# Patient Record
Sex: Female | Born: 1980
Health system: Southern US, Community
[De-identification: ages and names within clinical notes are randomized; demographics above are authoritative.]

## PROBLEM LIST (undated history)

## (undated) DIAGNOSIS — F32A Depression, unspecified: Secondary | ICD-10-CM

## (undated) DIAGNOSIS — R51 Headache: Secondary | ICD-10-CM

## (undated) DIAGNOSIS — G43109 Migraine with aura, not intractable, without status migrainosus: Secondary | ICD-10-CM

## (undated) DIAGNOSIS — R519 Headache, unspecified: Secondary | ICD-10-CM

## (undated) DIAGNOSIS — J45909 Unspecified asthma, uncomplicated: Secondary | ICD-10-CM

## (undated) DIAGNOSIS — F329 Major depressive disorder, single episode, unspecified: Secondary | ICD-10-CM

## (undated) HISTORY — PX: SPINAL FUSION: SHX223

## (undated) HISTORY — DX: Depression, unspecified: F32.A

## (undated) HISTORY — DX: Major depressive disorder, single episode, unspecified: F32.9

## (undated) HISTORY — PX: TUBAL LIGATION: SHX77

## (undated) HISTORY — PX: SHOULDER ARTHROCENTESIS: SUR47

## (undated) HISTORY — DX: Migraine with aura, not intractable, without status migrainosus: G43.109

---

## 2002-12-27 HISTORY — PX: FRACTURE SURGERY: SHX138

## 2004-12-27 HISTORY — PX: LEEP: SHX91

## 2005-12-27 HISTORY — PX: DILATION AND CURETTAGE OF UTERUS: SHX78

## 2006-08-31 ENCOUNTER — Ambulatory Visit: Payer: Self-pay | Admitting: Obstetrics and Gynecology

## 2007-05-11 ENCOUNTER — Encounter: Payer: Self-pay | Admitting: Maternal & Fetal Medicine

## 2007-06-08 ENCOUNTER — Encounter: Payer: Self-pay | Admitting: Maternal & Fetal Medicine

## 2007-08-21 ENCOUNTER — Encounter: Payer: Self-pay | Admitting: Maternal and Fetal Medicine

## 2007-10-13 ENCOUNTER — Observation Stay: Payer: Self-pay | Admitting: Obstetrics and Gynecology

## 2007-10-16 ENCOUNTER — Encounter: Payer: Self-pay | Admitting: Maternal & Fetal Medicine

## 2007-10-20 ENCOUNTER — Observation Stay: Payer: Self-pay | Admitting: Obstetrics and Gynecology

## 2007-10-23 ENCOUNTER — Encounter: Payer: Self-pay | Admitting: Maternal & Fetal Medicine

## 2007-10-26 ENCOUNTER — Ambulatory Visit: Payer: Self-pay | Admitting: Obstetrics and Gynecology

## 2007-10-27 ENCOUNTER — Inpatient Hospital Stay: Payer: Self-pay | Admitting: Obstetrics and Gynecology

## 2014-01-10 LAB — HM PAP SMEAR: HM PAP: NEGATIVE

## 2014-05-13 ENCOUNTER — Ambulatory Visit: Payer: Self-pay

## 2015-04-23 ENCOUNTER — Emergency Department
Admit: 2015-04-23 | Disposition: A | Discharge status: Home / Self Care | Payer: Self-pay | Admitting: Emergency Medicine

## 2015-04-23 LAB — URINALYSIS, COMPLETE
BILIRUBIN, UR: NEGATIVE
Blood: NEGATIVE
Glucose,UR: NEGATIVE mg/dL (ref 0–75)
KETONE: NEGATIVE
Leukocyte Esterase: NEGATIVE
Nitrite: NEGATIVE
PH: 5 (ref 4.5–8.0)
Protein: NEGATIVE
Specific Gravity: 1.023 (ref 1.003–1.030)

## 2015-04-23 LAB — CBC WITH DIFFERENTIAL/PLATELET
BASOS ABS: 0 10*3/uL (ref 0.0–0.1)
BASOS PCT: 0.2 %
Eosinophil #: 0 10*3/uL (ref 0.0–0.7)
Eosinophil %: 0.6 %
HCT: 42.1 % (ref 35.0–47.0)
HGB: 14.1 g/dL (ref 12.0–16.0)
LYMPHS PCT: 18.6 %
Lymphocyte #: 1.3 10*3/uL (ref 1.0–3.6)
MCH: 30.1 pg (ref 26.0–34.0)
MCHC: 33.4 g/dL (ref 32.0–36.0)
MCV: 90 fL (ref 80–100)
Monocyte #: 0.6 x10 3/mm (ref 0.2–0.9)
Monocyte %: 8.1 %
NEUTROS PCT: 72.5 %
Neutrophil #: 4.9 10*3/uL (ref 1.4–6.5)
Platelet: 158 10*3/uL (ref 150–440)
RBC: 4.67 10*6/uL (ref 3.80–5.20)
RDW: 13.4 % (ref 11.5–14.5)
WBC: 6.8 10*3/uL (ref 3.6–11.0)

## 2015-04-23 LAB — COMPREHENSIVE METABOLIC PANEL
ANION GAP: 8 (ref 7–16)
Albumin: 4.7 g/dL
Alkaline Phosphatase: 101 U/L
BILIRUBIN TOTAL: 1.5 mg/dL — AB
BUN: 13 mg/dL
Calcium, Total: 9 mg/dL
Chloride: 104 mmol/L
Co2: 27 mmol/L
Creatinine: 0.86 mg/dL
Glucose: 102 mg/dL — ABNORMAL HIGH
Potassium: 3.6 mmol/L
SGOT(AST): 384 U/L — ABNORMAL HIGH
SGPT (ALT): 301 U/L — ABNORMAL HIGH
Sodium: 139 mmol/L
Total Protein: 8.1 g/dL

## 2015-05-09 ENCOUNTER — Other Ambulatory Visit: Payer: Self-pay

## 2015-05-09 DIAGNOSIS — Z800 Family history of malignant neoplasm of digestive organs: Secondary | ICD-10-CM | POA: Diagnosis not present

## 2015-05-09 DIAGNOSIS — Z8249 Family history of ischemic heart disease and other diseases of the circulatory system: Secondary | ICD-10-CM | POA: Diagnosis not present

## 2015-05-09 DIAGNOSIS — Z806 Family history of leukemia: Secondary | ICD-10-CM | POA: Diagnosis not present

## 2015-05-09 DIAGNOSIS — R51 Headache: Secondary | ICD-10-CM | POA: Diagnosis not present

## 2015-05-09 DIAGNOSIS — J45990 Exercise induced bronchospasm: Secondary | ICD-10-CM | POA: Diagnosis not present

## 2015-05-09 DIAGNOSIS — Z803 Family history of malignant neoplasm of breast: Secondary | ICD-10-CM | POA: Diagnosis not present

## 2015-05-09 DIAGNOSIS — Z79899 Other long term (current) drug therapy: Secondary | ICD-10-CM | POA: Diagnosis not present

## 2015-05-09 DIAGNOSIS — K8010 Calculus of gallbladder with chronic cholecystitis without obstruction: Secondary | ICD-10-CM | POA: Diagnosis present

## 2015-05-09 NOTE — Patient Instructions (Signed)
  Your procedure is scheduled on: May 16, 2015. Report to Same Day Surgery. To find out your arrival time please call [phone] between 1PM - 3PM on Thursday May 15, 2015.  Remember: Instructions that are not followed completely may result in serious medical risk, up to and including death, or upon the discretion of your surgeon and anesthesiologist your surgery may need to be rescheduled.    __X__ 1. Do not eat food or drink liquids after midnight. No gum chewing or hard candies.     __X__ 2. No Alcohol for 24 hours before or after surgery.   ____ 3. Bring all medications with you on the day of surgery if instructed.    __X__ 4. Notify your doctor if there is any change in your medical condition     (cold, fever, infections).     Do not wear jewelry, make-up, hairpins, clips or nail polish.  Do not wear lotions, powders, or perfumes. You may wear deodorant.  Do not shave 48 hours prior to surgery. Men may shave face and neck.  Do not bring valuables to the hospital.    [facility] is not responsible for any belongings or valuables.               Contacts, dentures or bridgework may not be worn into surgery.  Leave your suitcase in the car. After surgery it may be brought to your room.  For patients admitted to the hospital, discharge time is determined by your treatment team.   Patients discharged the day of surgery will not be allowed to drive home.   Please read over the following fact sheets that you were given:      __X__ Take these medicines the morning of surgery with A SIP OF WATER:    1. [place]   ____ Fleet Enema (as directed)   ____ Use CHG Soap as directed  __X Use inhalers on the day of surgery  ____ Stop metformin 2 days prior to surgery    ____ Take 1/2 of usual insulin dose the night before surgery and none on the morning of surgery.   ____ Stop Coumadin/Plavix/aspirin on DOES NOT APPLY  ____ Stop Anti-inflammatories on TODAY, MAY  TAKE TYLENOL IF NEEDED. ____ Stop supplements until after surgery.    ____ Bring C-Pap to the hospital.

## 2015-05-15 ENCOUNTER — Encounter
Admission: RE | Admit: 2015-05-15 | Discharge: 2015-05-15 | Disposition: A | Discharge status: Home / Self Care | Payer: BC Managed Care – PPO | Source: Ambulatory Visit | Attending: Surgery | Admitting: Surgery

## 2015-05-15 DIAGNOSIS — K8010 Calculus of gallbladder with chronic cholecystitis without obstruction: Secondary | ICD-10-CM | POA: Diagnosis not present

## 2015-05-15 LAB — URINALYSIS COMPLETE WITH MICROSCOPIC (ARMC ONLY)
BILIRUBIN URINE: NEGATIVE
GLUCOSE, UA: NEGATIVE mg/dL
Ketones, ur: NEGATIVE mg/dL
NITRITE: NEGATIVE
Protein, ur: NEGATIVE mg/dL
Specific Gravity, Urine: 1.008 (ref 1.005–1.030)
pH: 6 (ref 5.0–8.0)

## 2015-05-15 LAB — BILIRUBIN, TOTAL: Total Bilirubin: 0.2 mg/dL — ABNORMAL LOW (ref 0.3–1.2)

## 2015-05-16 ENCOUNTER — Encounter: Payer: Self-pay | Admitting: *Deleted

## 2015-05-16 ENCOUNTER — Ambulatory Visit: Payer: BC Managed Care – PPO | Admitting: Anesthesiology

## 2015-05-16 ENCOUNTER — Ambulatory Visit: Payer: BC Managed Care – PPO

## 2015-05-16 ENCOUNTER — Encounter
Admission: RE | Disposition: A | Discharge status: Home / Self Care | Payer: Self-pay | Source: Ambulatory Visit | Attending: Surgery

## 2015-05-16 ENCOUNTER — Ambulatory Visit
Admission: RE | Admit: 2015-05-16 | Discharge: 2015-05-16 | Disposition: A | Discharge status: Home / Self Care | Payer: BC Managed Care – PPO | Source: Ambulatory Visit | Attending: Surgery | Admitting: Surgery

## 2015-05-16 DIAGNOSIS — K8010 Calculus of gallbladder with chronic cholecystitis without obstruction: Secondary | ICD-10-CM | POA: Insufficient documentation

## 2015-05-16 DIAGNOSIS — Z8249 Family history of ischemic heart disease and other diseases of the circulatory system: Secondary | ICD-10-CM | POA: Insufficient documentation

## 2015-05-16 DIAGNOSIS — Z800 Family history of malignant neoplasm of digestive organs: Secondary | ICD-10-CM | POA: Insufficient documentation

## 2015-05-16 DIAGNOSIS — Z79899 Other long term (current) drug therapy: Secondary | ICD-10-CM | POA: Insufficient documentation

## 2015-05-16 DIAGNOSIS — R51 Headache: Secondary | ICD-10-CM | POA: Insufficient documentation

## 2015-05-16 DIAGNOSIS — Z803 Family history of malignant neoplasm of breast: Secondary | ICD-10-CM | POA: Insufficient documentation

## 2015-05-16 DIAGNOSIS — Z806 Family history of leukemia: Secondary | ICD-10-CM | POA: Insufficient documentation

## 2015-05-16 DIAGNOSIS — J45990 Exercise induced bronchospasm: Secondary | ICD-10-CM | POA: Insufficient documentation

## 2015-05-16 DIAGNOSIS — K819 Cholecystitis, unspecified: Secondary | ICD-10-CM

## 2015-05-16 HISTORY — DX: Unspecified asthma, uncomplicated: J45.909

## 2015-05-16 HISTORY — PX: CHOLECYSTECTOMY: SHX55

## 2015-05-16 HISTORY — DX: Headache, unspecified: R51.9

## 2015-05-16 HISTORY — DX: Headache: R51

## 2015-05-16 LAB — POCT PREGNANCY, URINE: Preg Test, Ur: NEGATIVE

## 2015-05-16 SURGERY — LAPAROSCOPIC CHOLECYSTECTOMY WITH INTRAOPERATIVE CHOLANGIOGRAM
Anesthesia: General | Site: Abdomen | Wound class: Clean Contaminated

## 2015-05-16 MED ORDER — NEOSTIGMINE METHYLSULFATE 10 MG/10ML IV SOLN
INTRAVENOUS | Status: DC | PRN
  Administered 2015-05-16: 4 mg via INTRAVENOUS

## 2015-05-16 MED ORDER — ROCURONIUM BROMIDE 100 MG/10ML IV SOLN
INTRAVENOUS | Status: DC | PRN
  Administered 2015-05-16: 40 mg via INTRAVENOUS
  Administered 2015-05-16: 5 mg via INTRAVENOUS

## 2015-05-16 MED ORDER — HYDROMORPHONE HCL 1 MG/ML IJ SOLN
INTRAMUSCULAR | Status: DC
Start: 2015-05-16 — End: 2015-05-16
  Filled 2015-05-16: qty 1

## 2015-05-16 MED ORDER — SODIUM CHLORIDE 0.9 % IJ SOLN
INTRAMUSCULAR | Status: AC
  Filled 2015-05-16: qty 50

## 2015-05-16 MED ORDER — HYDROMORPHONE HCL 1 MG/ML IJ SOLN
0.2500 mg | INTRAMUSCULAR | Status: DC | PRN
  Administered 2015-05-16 (×4): 0.5 mg via INTRAVENOUS

## 2015-05-16 MED ORDER — HYDROMORPHONE HCL 1 MG/ML IJ SOLN
INTRAMUSCULAR | Status: AC
  Filled 2015-05-16: qty 1

## 2015-05-16 MED ORDER — FAMOTIDINE 20 MG PO TABS
20.0000 mg | ORAL_TABLET | Freq: Once | ORAL | Status: AC
  Administered 2015-05-16: 20 mg via ORAL

## 2015-05-16 MED ORDER — OXYCODONE-ACETAMINOPHEN 5-325 MG PO TABS: 1.0000 | ORAL_TABLET | ORAL | Status: DC | PRN

## 2015-05-16 MED ORDER — KETOROLAC TROMETHAMINE 30 MG/ML IJ SOLN
INTRAMUSCULAR | Status: DC | PRN
  Administered 2015-05-16: 30 mg via INTRAVENOUS

## 2015-05-16 MED ORDER — ONDANSETRON HCL 4 MG/2ML IJ SOLN
INTRAMUSCULAR | Status: DC | PRN
  Administered 2015-05-16: 4 mg via INTRAVENOUS

## 2015-05-16 MED ORDER — FAMOTIDINE 20 MG PO TABS
ORAL_TABLET | ORAL | Status: AC
Start: 2015-05-16 — End: 2015-05-16
  Administered 2015-05-16: 20 mg
  Filled 2015-05-16: qty 1

## 2015-05-16 MED ORDER — GLYCOPYRROLATE 0.2 MG/ML IJ SOLN
INTRAMUSCULAR | Status: DC | PRN
  Administered 2015-05-16: 0.6 mg via INTRAVENOUS

## 2015-05-16 MED ORDER — MIDAZOLAM HCL 2 MG/2ML IJ SOLN
INTRAMUSCULAR | Status: DC | PRN
  Administered 2015-05-16: 2 mg via INTRAVENOUS

## 2015-05-16 MED ORDER — DEXAMETHASONE SODIUM PHOSPHATE 10 MG/ML IJ SOLN
INTRAMUSCULAR | Status: AC
  Administered 2015-05-16: 8 mg
  Filled 2015-05-16: qty 1

## 2015-05-16 MED ORDER — LACTATED RINGERS IV SOLN
INTRAVENOUS | Status: DC
  Administered 2015-05-16: 11:00:00 via INTRAVENOUS

## 2015-05-16 MED ORDER — HEPARIN SODIUM (PORCINE) 5000 UNIT/ML IJ SOLN
INTRAMUSCULAR | Status: AC
  Filled 2015-05-16: qty 1

## 2015-05-16 MED ORDER — FENTANYL CITRATE (PF) 100 MCG/2ML IJ SOLN
INTRAMUSCULAR | Status: DC | PRN
  Administered 2015-05-16: 50 ug via INTRAVENOUS
  Administered 2015-05-16: 100 ug via INTRAVENOUS
  Administered 2015-05-16 (×3): 50 ug via INTRAVENOUS

## 2015-05-16 MED ORDER — DEXAMETHASONE SODIUM PHOSPHATE 4 MG/ML IJ SOLN
8.0000 mg | Freq: Once | INTRAMUSCULAR | Status: AC | PRN
  Administered 2015-05-16: 8 mg via INTRAVENOUS
  Filled 2015-05-16: qty 2

## 2015-05-16 MED ORDER — PROPOFOL 10 MG/ML IV BOLUS
INTRAVENOUS | Status: DC | PRN
  Administered 2015-05-16: 200 mg via INTRAVENOUS

## 2015-05-16 SURGICAL SUPPLY — 37 items
APPLIER CLIP ROT 10 11.4 M/L (STAPLE) ×3
BENZOIN TINCTURE PRP APPL 2/3 (GAUZE/BANDAGES/DRESSINGS) ×3 IMPLANT
CANISTER SUCT 1200ML W/VALVE (MISCELLANEOUS) ×3 IMPLANT
CANNULA DILATOR 10 W/SLV (CANNULA) ×2 IMPLANT
CANNULA DILATOR 10MM W/SLV (CANNULA) ×1
CATH REDDICK CHOLANGI 4FR 50CM (CATHETERS) ×3 IMPLANT
CHLORAPREP W/TINT 26ML (MISCELLANEOUS) ×3 IMPLANT
CLIP APPLIE ROT 10 11.4 M/L (STAPLE) ×1 IMPLANT
CLOSURE WOUND 1/2 X4 (GAUZE/BANDAGES/DRESSINGS) ×1
DRAPE SHEET LG 3/4 BI-LAMINATE (DRAPES) ×3 IMPLANT
GAUZE SPONGE 4X4 12PLY STRL (GAUZE/BANDAGES/DRESSINGS) ×3 IMPLANT
GLOVE BIO SURGEON STRL SZ7.5 (GLOVE) ×3 IMPLANT
GOWN STRL REUS W/ TWL LRG LVL3 (GOWN DISPOSABLE) ×4 IMPLANT
GOWN STRL REUS W/TWL LRG LVL3 (GOWN DISPOSABLE) ×8
IRRIGATION STRYKERFLOW (MISCELLANEOUS) ×1 IMPLANT
IRRIGATOR STRYKERFLOW (MISCELLANEOUS) ×3
IV NS 1000ML (IV SOLUTION) ×2
IV NS 1000ML BAXH (IV SOLUTION) ×1 IMPLANT
KIT RM TURNOVER STRD PROC AR (KITS) ×3 IMPLANT
LABEL OR SOLS (LABEL) ×3 IMPLANT
NDL INSUFF ACCESS 14 VERSASTEP (NEEDLE) ×3 IMPLANT
NEEDLE FILTER BLUNT 18X 1/2SAF (NEEDLE) ×2
NEEDLE FILTER BLUNT 18X1 1/2 (NEEDLE) ×1 IMPLANT
NS IRRIG 500ML POUR BTL (IV SOLUTION) ×3 IMPLANT
PACK LAP CHOLECYSTECTOMY (MISCELLANEOUS) ×3 IMPLANT
PAD GROUND ADULT SPLIT (MISCELLANEOUS) ×3 IMPLANT
SCISSORS METZENBAUM CVD 33 (INSTRUMENTS) ×3 IMPLANT
SEAL FOR SCOPE WARMER C3101 (MISCELLANEOUS) ×3 IMPLANT
SLEEVE ENDOPATH XCEL 5M (ENDOMECHANICALS) ×3 IMPLANT
STRIP CLOSURE SKIN 1/2X4 (GAUZE/BANDAGES/DRESSINGS) ×2 IMPLANT
SUT CHROMIC 5 0 RB 1 27 (SUTURE) ×3 IMPLANT
SUT VIC AB 0 CT2 27 (SUTURE) IMPLANT
SYR 3ML LL SCALE MARK (SYRINGE) ×3 IMPLANT
TROCAR XCEL NON-BLD 11X100MML (ENDOMECHANICALS) ×3 IMPLANT
TROCAR XCEL NON-BLD 5MMX100MML (ENDOMECHANICALS) ×3 IMPLANT
TUBING INSUFFLATOR HI FLOW (MISCELLANEOUS) ×3 IMPLANT
WATER STERILE IRR 1000ML POUR (IV SOLUTION) ×3 IMPLANT

## 2015-05-16 NOTE — Anesthesia Preprocedure Evaluation (Signed)
Anesthesia Evaluation  Patient identified by MRN, date of birth, ID band Patient awake    Reviewed: Allergy & Precautions, NPO status , Patient's Chart, lab work & pertinent test results  Airway Mallampati: I  TM Distance: >3 FB Neck ROM: Full    Dental  (+) Teeth Intact   Pulmonary asthma ,  Mild, exercise induced, puffers prn. breath sounds clear to auscultation  Pulmonary exam normal       Cardiovascular Exercise Tolerance: Good negative cardio ROS Normal cardiovascular examRhythm:Regular Rate:Normal  A healthy runner training for a half marathon.   Neuro/Psych  Headaches,    GI/Hepatic Cholelithiasis.   Endo/Other  negative endocrine ROS  Renal/GU negative Renal ROS     Musculoskeletal negative musculoskeletal ROS (+)   Abdominal (+)  Abdomen: soft.    Peds  Hematology negative hematology ROS (+)   Anesthesia Other Findings   Reproductive/Obstetrics Beta HCG negative.                             Anesthesia Physical Anesthesia Plan  ASA: II  Anesthesia Plan: General   Post-op Pain Management:    Induction: Intravenous  Airway Management Planned: Oral ETT  Additional Equipment:   Intra-op Plan:   Post-operative Plan: Extubation in OR  Informed Consent: I have reviewed the patients History and Physical, chart, labs and discussed the procedure including the risks, benefits and alternatives for the proposed anesthesia with the patient or authorized representative who has indicated his/her understanding and acceptance.     Plan Discussed with: CRNA  Anesthesia Plan Comments:         Anesthesia Quick Evaluation

## 2015-05-16 NOTE — Discharge Instructions (Signed)
Take Tylenol or Percocet as needed for pain.   Remove dressings on Saturday. May shower SundayGeneral Anesthesia, Care After Refer to this sheet in the next few weeks. These instructions provide you with information on caring for yourself after your procedure. Your health care provider may also give you more specific instructions. Your treatment has been planned according to current medical practices, but problems sometimes occur. Call your health care provider if you have any problems or questions after your procedure. WHAT TO EXPECT AFTER THE PROCEDURE After the procedure, it is typical to experience:  Sleepiness.  Nausea and vomiting. HOME CARE INSTRUCTIONS  For the first 24 hours after general anesthesia:  Have a responsible person with you.  Do not drive a car. If you are alone, do not take public transportation.  Do not drink alcohol.  Do not take medicine that has not been prescribed by your health care provider.  Do not sign important papers or make important decisions.  You may resume a normal diet and activities as directed by your health care provider.  Change bandages (dressings) as directed.  If you have questions or problems that seem related to general anesthesia, call the hospital and ask for the anesthetist or anesthesiologist on call. SEEK MEDICAL CARE IF:  You have nausea and vomiting that continue the day after anesthesia.  You develop a rash. SEEK IMMEDIATE MEDICAL CARE IF:   You have difficulty breathing.  You have chest pain.  You have any allergic problems. Document Released: 03/21/2001 Document Revised: 12/18/2013 Document Reviewed: 06/28/2013 [facility] Patient Information 2015 [place], [place]. This information is not intended to replace advice given to you by your health care provider. Make sure you discuss any questions you have with your health care provider.

## 2015-05-16 NOTE — Transfer of Care (Signed)
Immediate Anesthesia Transfer of Care Note  Patient: Regina Anderson  Procedure(s) Performed: Procedure(s): LAPAROSCOPIC CHOLECYSTECTOMY WITH INTRAOPERATIVE CHOLANGIOGRAM (N/A)  Patient Location: PACU  Anesthesia Type:General  Level of Consciousness: sedated and responds to stimulation  Airway & Oxygen Therapy: Patient Spontanous Breathing and Patient connected to face mask oxygen  Post-op Assessment: Report given to RN and Post -op Vital signs reviewed and stable  Post vital signs: Reviewed and stable  Last Vitals:  Filed Vitals:   05/16/15 1032  BP: 111/43  Pulse: 83  Temp: 36.5 C  Resp: 16    Complications: No apparent anesthesia complications

## 2015-05-16 NOTE — Op Note (Addendum)
OPERATIVE REPORT  PREOPERATIVE DIAGNOSIS:  Chronic cholecystitis cholelithiasis  POSTOPERATIVE DIAGNOSIS: Chronic cholecystitis cholelithiasis  PROCEDURE: Laparoscopic cholecystectomy   ANESTHESIA: General  SURGEON: Rochel Brome M.D.  INDICATIONS: She is a history of right upper quadrant pain associated with nausea. Ultrasound demonstrated gallstones. Surgery was recommended for definitive treatment  With the patient on the operating table in the supine position under general endotracheal anesthesia the abdomen was prepared with ChloraPrep solution and draped in a sterile manner. A short incision was made in the inferior aspect of the umbilicus and carried down to the deep fascia which was grasped with a laryngeal hook. The 10 mm 0 laparoscope was advanced into the trocar and dissected down through the abdominal wall and into the peritoneal cavity viewing this with the video monitor. Another incision was made in the epigastrium slightly to the right of the midline to introduce an 11 mm cannula. 2 incisions were made in the lateral aspect of the right upper quadrant to introduce 2   5 mm cannulas. Initial inspection revealed the liver was smooth. There were a few adhesions in the pelvis. The gallbladder was retracted towards the right shoulder.   The gallbladder neck was retracted inferiorly and laterally.  The porta hepatis was identified. The gallbladder was mobilized with incision of the visceral peritoneum. The cystic duct was dissected free from surrounding structures. The cystic artery was dissected free from surrounding structures. A critical view of safety was demonstrated  An Endo Clip was placed across the cystic duct adjacent to the gallbladder neck. An incision was made in the cystic duct to introduce a Reddick catheter. The cystic duct was found to be small in size and the Reddick catheter would not thread into the cystic duct. Therefore a cholangiogram was not done  The Reddick catheter  was removed. The cystic duct was doubly ligated with endoclips and divided. The cystic artery was controlled with double endoclips and divided. The gallbladder was dissected free from the liver with use of hook and cautery and blunt dissection. Bleeding was minimal and hemostasis surgically intact. The gallbladder was delivered up through the infraumbilical incision opened and suctioned.. There was a large stone which was crushed and removed in a piecemeal fashion with the stone forceps and stone scoop. The gallbladder was then removed and submitted in formalin with stones for routine pathology. The right upper quadrant was further inspected and determined hemostasis was intact. The cannulas were removed seen and no significant bleeding from the cannula sites. The skin incisions were closed with interrupted 5-0 chromic subcutaneous suture benzoin and Steri-Strips. Gauze dressings were applied with paper tape.  The patient appeared to be in satisfactory condition and was prepared for transfer to the recovery room  [place].D.

## 2015-05-16 NOTE — Anesthesia Postprocedure Evaluation (Signed)
  Anesthesia Post-op Note  Patient: [name]  Procedure(s) Performed: Procedure(s): LAPAROSCOPIC CHOLECYSTECTOMY WITH INTRAOPERATIVE CHOLANGIOGRAM (N/A)  Anesthesia type:General  Patient location: PACU  Post pain: Pain level controlled  Post assessment: Post-op Vital signs reviewed, Patient's Cardiovascular Status Stable, Respiratory Function Stable, Patent Airway and No signs of Nausea or vomiting  Post vital signs: Reviewed and stable  Last Vitals:  Filed Vitals:   05/16/15 1425  BP:   Pulse: 61  Temp:   Resp: 11    Level of consciousness: awake, alert  and patient cooperative  Complications: No apparent anesthesia complications

## 2015-05-16 NOTE — Anesthesia Procedure Notes (Signed)
Procedure Name: Intubation Date/Time: 05/16/2015 11:44 AM Performed by: Jonna Clark Pre-anesthesia Checklist: Patient identified, Emergency Drugs available, Suction available, Patient being monitored and Timeout performed Patient Re-evaluated:Patient Re-evaluated prior to inductionOxygen Delivery Method: Circle system utilized Preoxygenation: Pre-oxygenation with 100% oxygen Intubation Type: IV induction Ventilation: Mask ventilation without difficulty Laryngoscope Size: Miller and 2 Grade View: Grade I Tube size: 7.0 mm Number of attempts: 1 Placement Confirmation: ETT inserted through vocal cords under direct vision,  positive ETCO2 and breath sounds checked- equal and bilateral Secured at: 22 cm Tube secured with: Tape Dental Injury: Teeth and Oropharynx as per pre-operative assessment

## 2015-05-16 NOTE — H&P (Signed)
  She recently noted her urine was dark. Follow-up total bilirubin was normal. Urinalysis with rare bacteria  She reports no abdominal pain since the day of the office visit and no other recent change in overall condition.  Plan is to do laparoscopic cholecystectomy today.  Rochel Brome M.D. 05/16/2015

## 2015-05-17 LAB — URINE CULTURE

## 2015-05-19 ENCOUNTER — Encounter: Payer: Self-pay | Admitting: Surgery

## 2015-05-21 LAB — SURGICAL PATHOLOGY

## 2015-07-23 DIAGNOSIS — F32A Depression, unspecified: Secondary | ICD-10-CM | POA: Insufficient documentation

## 2015-07-23 DIAGNOSIS — G43109 Migraine with aura, not intractable, without status migrainosus: Secondary | ICD-10-CM | POA: Insufficient documentation

## 2015-07-23 DIAGNOSIS — F329 Major depressive disorder, single episode, unspecified: Secondary | ICD-10-CM | POA: Insufficient documentation

## 2015-07-25 ENCOUNTER — Encounter: Payer: Self-pay | Admitting: Family Medicine

## 2015-07-25 ENCOUNTER — Ambulatory Visit (INDEPENDENT_AMBULATORY_CARE_PROVIDER_SITE_OTHER): Payer: BC Managed Care – PPO | Admitting: Family Medicine

## 2015-07-25 VITALS — BP 108/72 | HR 76 | Temp 98.0°F | Wt 197.0 lb

## 2015-07-25 DIAGNOSIS — F1010 Alcohol abuse, uncomplicated: Secondary | ICD-10-CM | POA: Insufficient documentation

## 2015-07-25 DIAGNOSIS — Z8379 Family history of other diseases of the digestive system: Secondary | ICD-10-CM | POA: Insufficient documentation

## 2015-07-25 DIAGNOSIS — F4321 Adjustment disorder with depressed mood: Secondary | ICD-10-CM | POA: Diagnosis not present

## 2015-07-25 MED ORDER — FLUOXETINE HCL 20 MG PO TABS: ORAL_TABLET | ORAL | Status: DC

## 2015-07-25 NOTE — Patient Instructions (Addendum)
Try to get plenty of fiber, 25 or more grams per day Please do start working with a therapist Absolutely cut down on the alcohol, and use other coping strategies when stressed; alcohol will numb, but will not fix the problem and can cause liver damage and kill brain cells Limits for women are 7 drinks total per week NEVER drink and drive Start the Prozac at 20 mg daily and then go up to 40 mg after three weeks Return in four weeks Call me sooner if needed, especially if you become overly energetic, etc. Check out moderate drinking for ideas [email or web address]

## 2015-07-25 NOTE — Progress Notes (Signed)
BP 108/72 mmHg  Pulse 76  Temp(Src) 98 F (36.7 C)  Wt 197 lb (89.359 kg)  SpO2 100%   Subjective:    Patient ID: Regina Anderson, female    DOB: [birth year], 34 y.o.   MRN: [id number]  HPI: [name] is a 34 y.o. female  Chief Complaint  Patient presents with  . Stress    MGM passed away in 06-16-23, since 06/16/2023 to Now, there has been alot of family dysfunction   She is suffering from stress with family issues; grandmother passed away, left 63 of estate to patient's brother and it's tearing relationships apart, patient's mother calling every day or every other day  She is drinking every day; has always been a social drinker; never previously dealt with stress by drinking; no other drugs Workdays, she will start drinking as soon as she walks in the door, 2-3 drinks on work night Weekends, she will start drinking noonish; will have 5-6 drinks  She cannot function with all the stress She called and realized she needs to be on something She was on Prozac at age 29 year; her father had an affair with a 36 year old, it ruined her Junior year of high school  Relevant past medical, surgical, family and social history reviewed and updated as indicated. Interim medical history since our last visit reviewed. Fam hx of diverticulosis; patient asks if something she should be tested for Allergies and medications reviewed and updated.  Review of Systems  Constitutional: Positive for unexpected weight change (weight gain).   Per HPI unless specifically indicated above     Objective:    BP 108/72 mmHg  Pulse 76  Temp(Src) 98 F (36.7 C)  Wt 197 lb (89.359 kg)  SpO2 100%  Wt Readings from Last 3 Encounters:  07/25/15 197 lb (89.359 kg)  05/08/15 192 lb (87.091 kg)  05/16/15 188 lb (85.276 kg)    Physical Exam  Constitutional: She appears well-developed and well-nourished.  Weight gain noted  Eyes: EOM are normal. No scleral icterus.  Cardiovascular: Normal rate.    Pulmonary/Chest: Effort normal.  Abdominal: Soft. She exhibits no distension. There is no tenderness.  Psychiatric: Her mood appears not anxious. Her affect is not angry, not blunt, not labile and not inappropriate. Her speech is not rapid and/or pressured. She is not slowed, not withdrawn and not actively hallucinating. Thought content is not paranoid and not delusional. Cognition and memory are not impaired. She does not express impulsivity or inappropriate judgment. She does not exhibit a depressed mood.      Assessment & Plan:   Problem List Items Addressed This Visit      Other   Excessive drinking of alcohol    Explained too much alcohol, not a healthy coping strategy, can cause problems; cut back; introduced her to moderate drinking webiste, never drink and drive      Adjustment disorder with depressed mood - Primary    encouraged patient to get started in counseling; list of counselors provided with phone numbers; start Prozac; discussed risk of hypomania, reasons to call right away; return for f/u in 4 weeks, but call sooner if needed; explained she should not be drinking this much, especially as we start the Prozac      Family history of diverticulitis of colon    Encouraged 25+ grams of fiber a day, avoid constipation, stay hydrated to help lessen chance of developing diverticulosis in the future  Follow up plan: Return in about 4 weeks (around 08/22/2015).  Meds ordered this encounter  Medications  . FLUoxetine (PROZAC) 20 MG tablet    Sig: One by mouth daily for three weeks, then two pills daily    Dispense:  39 tablet    Refill:  0   An after-visit summary was printed and given to the patient at [facility].  Please see the patient instructions which may contain other information and recommendations beyond what is mentioned above in the assessment and plan.

## 2015-07-25 NOTE — Assessment & Plan Note (Signed)
encouraged patient to get started in counseling; list of counselors provided with phone numbers; start Prozac; discussed risk of hypomania, reasons to call right away; return for f/u in 4 weeks, but call sooner if needed; explained she should not be drinking this much, especially as we start the Prozac

## 2015-07-25 NOTE — Assessment & Plan Note (Signed)
Encouraged 25+ grams of fiber a day, avoid constipation, stay hydrated to help lessen chance of developing diverticulosis in the future

## 2015-07-25 NOTE — Assessment & Plan Note (Signed)
Explained too much alcohol, not a healthy coping strategy, can cause problems; cut back; introduced her to moderate drinking webiste, never drink and drive

## 2015-08-20 ENCOUNTER — Ambulatory Visit (INDEPENDENT_AMBULATORY_CARE_PROVIDER_SITE_OTHER): Payer: BC Managed Care – PPO | Admitting: Family Medicine

## 2015-08-20 VITALS — BP 112/71 | HR 77 | Temp 99.5°F | Ht 68.5 in | Wt 197.0 lb

## 2015-08-20 DIAGNOSIS — S93401A Sprain of unspecified ligament of right ankle, initial encounter: Secondary | ICD-10-CM

## 2015-08-20 DIAGNOSIS — F1010 Alcohol abuse, uncomplicated: Secondary | ICD-10-CM

## 2015-08-20 DIAGNOSIS — F4321 Adjustment disorder with depressed mood: Secondary | ICD-10-CM

## 2015-08-21 ENCOUNTER — Encounter: Payer: Self-pay | Admitting: Family Medicine

## 2015-08-22 DIAGNOSIS — S93401A Sprain of unspecified ligament of right ankle, initial encounter: Secondary | ICD-10-CM | POA: Insufficient documentation

## 2015-08-22 MED ORDER — FLUOXETINE HCL 40 MG PO CAPS: 40.0000 mg | ORAL_CAPSULE | Freq: Every day | ORAL | Status: DC

## 2015-08-22 NOTE — Patient Instructions (Signed)
Computers were down at the time of today's visit so instructions were all given verbally

## 2015-08-22 NOTE — Assessment & Plan Note (Signed)
Able to wear weight, no pops or cracks during injury; neurovascularly intact; no reason to image; will have her continue brace, as well as RICE; may use OTC NSAID if needed; explained that she will always be at risk for future sprains, and to do A-Z exercises, demonstrated; she agrees

## 2015-08-22 NOTE — Assessment & Plan Note (Signed)
This has resolved on the SSRI; so glad she is doing better in this respect

## 2015-08-22 NOTE — Assessment & Plan Note (Signed)
Much improved on the SSRI without any adverse side effects; will have her go to 40 mg as planned; advised that if she develops any mania or hypomania (described symptoms), then absolutely call right away; will have her call with update in a few weeks; Rx of new dose sent to pharmacy

## 2015-08-22 NOTE — Progress Notes (Signed)
Computers were down at the time of patient's visit today No new medicine changes per CMA; no new medical problems per CMA  BP 112/71 mmHg  Pulse 77  Temp(Src) 99.5 F (37.5 C)  Ht 5' 8.5" (1.74 m)  Wt 197 lb (89.359 kg)  BMI 29.51 kg/m2  SpO2 99%   Subjective:    Patient ID: Regina Anderson, female    DOB: 1981-04-19, 34 y.o.   MRN: [id number]  HPI: Regina Anderson is a 34 y.o. female  Chief Complaint  Patient presents with  . Depression   Patient is here for f/u of depression; she says she is still just taking 20 mg of Prozac daily; forgot to go up to 40 mg daily, but she will go up now; she just forgot to do it; she denies headaches, denies nausea, denies any significantly elevated mood (hypomania); she is no longer drinking alcohol to help cope with things  She rolled her ankle three times yesterday; she had been wearing flat shoes all summer and then yesterday, had on a pair of clogs; she never did hear a loud pop or crack; she has been able to bear weight on the ankle, but it hurts; she has noticed a little bruising laterally; she has propped her right foot and leg up on a cooler to give it a little elevation, but it's not been up higher than her hip; she is able to flex and extend the ankle without pain but it hurts to move it side to side; she has used a brace for ankle support Relevant past medical, surgical, family and social history reviewed and updated as indicated. Interim medical history since our last visit reviewed. Allergies and medications reviewed and updated.  Review of Systems Per HPI unless specifically indicated above     Objective:    BP 112/71 mmHg  Pulse 77  Temp(Src) 99.5 F (37.5 C)  Ht 5' 8.5" (1.74 m)  Wt 197 lb (89.359 kg)  BMI 29.51 kg/m2  SpO2 99%  Wt Readings from Last 3 Encounters:  08/20/15 197 lb (89.359 kg)  07/25/15 197 lb (89.359 kg)  05/08/15 192 lb (87.091 kg)    Physical Exam  Constitutional: She appears well-developed and  well-nourished. No distress.  HENT:  Head: Normocephalic and atraumatic.  Eyes: EOM are normal. No scleral icterus.  Neck: No thyromegaly present.  Cardiovascular: Normal rate.   No murmur heard. Pulmonary/Chest: Effort normal. No respiratory distress. She has no wheezes.  Abdominal: She exhibits no distension.  Musculoskeletal: She exhibits no edema.       Right ankle: She exhibits decreased range of motion (with eversion and inversion), swelling (mild) and ecchymosis (very slight bruising / discoloration laterally). She exhibits no deformity and normal pulse. Tenderness. Posterior TFL tenderness found. No lateral malleolus, no medial malleolus, no head of 5th metatarsal and no proximal fibula tenderness found. Achilles tendon normal.  Neurological: She is alert. She exhibits normal muscle tone.  Skin: Skin is warm and dry. She is not diaphoretic. No pallor.  Psychiatric: She has a normal mood and affect. Her behavior is normal. Judgment and thought content normal.      Assessment & Plan:   Problem List Items Addressed This Visit    None       Follow up plan: No Follow-up on file.

## 2015-09-10 ENCOUNTER — Telehealth: Payer: Self-pay | Admitting: Family Medicine

## 2015-09-10 NOTE — Telephone Encounter (Signed)
Pt called wanted to let Dr. Sanda Klein know she does not feel any difference in the Prozac and she is also profusely sweating. Please advise. Thanks.

## 2015-09-10 NOTE — Telephone Encounter (Signed)
Routing to provider  

## 2015-09-11 ENCOUNTER — Ambulatory Visit: Payer: BC Managed Care – PPO | Admitting: Obstetrics and Gynecology

## 2015-09-11 NOTE — Telephone Encounter (Signed)
I called, left message, sorry I missed her, will try tomorrow

## 2015-09-12 ENCOUNTER — Ambulatory Visit: Payer: BC Managed Care – PPO | Admitting: Obstetrics and Gynecology

## 2015-09-12 NOTE — Telephone Encounter (Signed)
Routing to provider  

## 2015-09-12 NOTE — Telephone Encounter (Signed)
Pt called stated she is returning Dr. Delight Ovens call. Pt requested that staff tell Dr. Sanda Klein tag you are it. Thanks.

## 2015-09-15 MED ORDER — BUPROPION HCL ER (XL) 150 MG PO TB24: 150.0000 mg | ORAL_TABLET | Freq: Every day | ORAL | Status: DC

## 2015-09-15 NOTE — Telephone Encounter (Signed)
She had terrible sweating with the prozac; GYN said that was probably the cause; last dose was Wed; may take 1-2 weeks to clear system; start wellbutrin; she denies hx of seizures or eating d/o; call with any problems

## 2015-09-15 NOTE — Telephone Encounter (Signed)
Pt called stated she is still trying to reach Dr. Sanda Klein. Please call her ASAP.

## 2015-09-22 ENCOUNTER — Telehealth: Payer: Self-pay

## 2015-09-22 MED ORDER — HYDROXYZINE PAMOATE 25 MG PO CAPS: 25.0000 mg | ORAL_CAPSULE | Freq: Four times a day (QID) | ORAL | Status: DC | PRN

## 2015-09-22 NOTE — Telephone Encounter (Signed)
She has started taking the Wellbutrin, but of course it is not in her system yet. She is having a lot of anxiety, not eating, not sleeping and wants to know if she can get something to help her temporarily until the Wellbutrin reaches it's full potential. (I had attempted sending this message to you earlier, but evidently it did not go. I see it under her notes, but I can't go back and access the message.)

## 2015-09-22 NOTE — Telephone Encounter (Signed)
Rx sent 

## 2015-09-22 NOTE — Telephone Encounter (Signed)
She has started taking the Wellbutrin, but of course it is not in her system yet. She is having a lot of anxiety, not eating, not sleeping and wants to know if she can get something to help her temporarily until the Wellbutrin reaches it's potential.

## 2015-09-23 NOTE — Telephone Encounter (Signed)
Patient notified, explained that if it does not help, to let us know.

## 2015-12-16 ENCOUNTER — Telehealth: Payer: Self-pay | Admitting: Obstetrics and Gynecology

## 2015-12-16 NOTE — Telephone Encounter (Signed)
Left detailed message that her insurance needs PA it was done, awaiting to her from insurance

## 2015-12-16 NOTE — Telephone Encounter (Signed)
This pt  [name] has sent multiple authorizations for [name] has expired.

## 2016-01-09 ENCOUNTER — Encounter: Payer: Self-pay | Admitting: Family Medicine

## 2016-01-09 ENCOUNTER — Ambulatory Visit (INDEPENDENT_AMBULATORY_CARE_PROVIDER_SITE_OTHER): Payer: BC Managed Care – PPO | Admitting: Family Medicine

## 2016-01-09 VITALS — BP 109/72 | HR 84 | Temp 98.6°F | Wt 206.0 lb

## 2016-01-09 DIAGNOSIS — M25552 Pain in left hip: Secondary | ICD-10-CM | POA: Diagnosis not present

## 2016-01-09 DIAGNOSIS — M25559 Pain in unspecified hip: Secondary | ICD-10-CM | POA: Insufficient documentation

## 2016-01-09 MED ORDER — HYDROXYZINE PAMOATE 25 MG PO CAPS: 25.0000 mg | ORAL_CAPSULE | Freq: Four times a day (QID) | ORAL | Status: DC | PRN

## 2016-01-09 MED ORDER — NAPROXEN 500 MG PO TABS: 500.0000 mg | ORAL_TABLET | Freq: Two times a day (BID) | ORAL | Status: DC

## 2016-01-09 NOTE — Progress Notes (Signed)
BP 109/72 mmHg  Pulse 84  Temp(Src) 98.6 F (37 C)  Wt 206 lb (93.441 kg)  SpO2 99%  LMP 12/20/2015 (Approximate)   Subjective:    Patient ID: Regina Anderson, female    DOB: [birth year], 35 y.o.   MRN: [id number]  HPI: Regina Anderson is a 35 y.o. female  Chief Complaint  Patient presents with  . Leg Pain    off and on for quite a while; friends told her it might be bursitis. Left leg.   She says she is falling apart She might have bursitis On and off for a year, there are certain ways that she moves and walks, she gets a shooting pain in her left thigh (proximal) She is usually up and moving around; 2 week break over Christmas, she did not nothing Then got up and got back into stuff when school started back Really bothering her know Taking ibuprofen; not touching it all Her co-workers are telling her she has bursitits She has heard it  When she takes a bath and gets out of the bath, it will make a sound as she lifts her leg to get up and out of the tub; sometimes pops during sex with hip out (missionary position); no shooting pain with the sound; not much localized pain with the sound No old injury No weakness of the left leg, no numbness of the thigh No bowel and bladder dysfunction  She would like refill of the hydroxyzine  Relevant past medical, surgical, family and social history reviewed and updated as indicated. No major family hx of major arthritis; no early joint replacements Interim medical history since our last visit reviewed. Allergies and medications reviewed and updated.  Review of Systems  Per HPI unless specifically indicated above     Objective:    BP 109/72 mmHg  Pulse 84  Temp(Src) 98.6 F (37 C)  Wt 206 lb (93.441 kg)  SpO2 99%  LMP 12/20/2015 (Approximate)  Wt Readings from Last 3 Encounters:  01/09/16 206 lb (93.441 kg)  08/20/15 197 lb (89.359 kg)  07/25/15 197 lb (89.359 kg)    Physical Exam  Constitutional: She appears well-developed  and well-nourished. No distress.  Eyes: EOM are normal. No scleral icterus.  Neck: No thyromegaly present.  Cardiovascular: Normal rate.   Pulmonary/Chest: Effort normal.  Abdominal: She exhibits no distension.  Musculoskeletal:       Left hip: She exhibits normal range of motion, normal strength, no tenderness, no bony tenderness and no deformity.  Skin: No pallor.  Psychiatric: She has a normal mood and affect. Her behavior is normal. Judgment and thought content normal.      Assessment & Plan:   Problem List Items Addressed This Visit      Other   Hip joint painful on movement - Primary    I don't think she has bursitis, but may have a tendonitis or a tendon rubbing a spur, other positional pathology; possibility too pelvic misalignment or leg length discrepancy over years now causing wear; also, she sprained her left ankle in August and this could be effect from some minor compensation; will have her see physical therapist and she will call in 4 weeks if not improving; no imaging ordered      Relevant Orders   Ambulatory referral to Physical Therapy      Follow up plan: Return if symptoms worsen or fail to improve.  An after-visit summary was printed and given to the patient at [facility].  Please see the patient instructions which may contain other information and recommendations beyond what is mentioned above in the assessment and plan.

## 2016-01-09 NOTE — Patient Instructions (Signed)
We'll refer you to physical therapy Stop ibuprofen Go back on naproxen for anti-inflammatory Try turmeric as a natural anti-inflammatory (for pain and arthritis). It comes in capsules where you buy aspirin and fish oil, but also as a spice where you buy pepper and garlic powder. Call me with an update in 4 weeks and let me know your progress and we can refer to orthopaedics at that time if needed

## 2016-01-18 NOTE — Assessment & Plan Note (Addendum)
I don't think she has bursitis, but may have a tendonitis or a tendon rubbing a spur, other positional pathology; possibility too pelvic misalignment or leg length discrepancy over years now causing wear; also, she sprained her left ankle in August and this could be effect from some minor compensation; will have her see physical therapist and she will call in 4 weeks if not improving; no imaging ordered

## 2016-01-29 DIAGNOSIS — F329 Major depressive disorder, single episode, unspecified: Secondary | ICD-10-CM | POA: Insufficient documentation

## 2016-01-29 DIAGNOSIS — G43119 Migraine with aura, intractable, without status migrainosus: Secondary | ICD-10-CM | POA: Insufficient documentation

## 2016-02-06 ENCOUNTER — Encounter: Payer: Self-pay | Admitting: Obstetrics and Gynecology

## 2016-02-20 ENCOUNTER — Telehealth: Payer: Self-pay | Admitting: Obstetrics and Gynecology

## 2016-02-20 NOTE — Telephone Encounter (Signed)
She needs her headache med refilled. She has an AE soon.

## 2016-02-23 NOTE — Telephone Encounter (Signed)
Put on [facility] desk for refill

## 2016-02-25 ENCOUNTER — Telehealth: Payer: Self-pay | Admitting: Obstetrics and Gynecology

## 2016-02-25 NOTE — Telephone Encounter (Signed)
Pt called last week and she had to reschedule her appt due to her son having the stomach bug, she is scheduled to come in 3/7 for her AE, she said she called last week and left message that she needed a refill on her migraine medicine and she said if it could be filled to last until her appt on 03/02/16 that would be fine,she said the pharmacy sent the request, do you know anything about this?

## 2016-02-25 NOTE — Telephone Encounter (Signed)
Called pt and she is going to fax [place] a new insurance card

## 2016-03-02 ENCOUNTER — Encounter: Payer: Self-pay | Admitting: Obstetrics and Gynecology

## 2016-03-02 ENCOUNTER — Ambulatory Visit (INDEPENDENT_AMBULATORY_CARE_PROVIDER_SITE_OTHER): Payer: BC Managed Care – PPO | Admitting: Obstetrics and Gynecology

## 2016-03-02 ENCOUNTER — Other Ambulatory Visit: Payer: Self-pay | Admitting: Obstetrics and Gynecology

## 2016-03-02 VITALS — BP 114/73 | HR 81 | Ht 70.0 in | Wt 204.2 lb

## 2016-03-02 DIAGNOSIS — Z01419 Encounter for gynecological examination (general) (routine) without abnormal findings: Secondary | ICD-10-CM | POA: Diagnosis not present

## 2016-03-02 DIAGNOSIS — E663 Overweight: Secondary | ICD-10-CM

## 2016-03-02 NOTE — Progress Notes (Signed)
Subjective:   Regina Anderson is a 35 y.o. No obstetric history on file. Caucasian female here for a routine well-woman exam.  Patient's last menstrual period was 02/17/2016 (exact date).    Current complaints: regained weight after loss of grandmother, mother and GB surgery last year PCP: Lada       Doesn't need labs  Social History: Sexual: heterosexual Marital Status: married Living situation: with family Occupation: unknown occupation Tobacco/alcohol: no tobacco use Illicit drugs: no history of illicit drug use  The following portions of the patient's history were reviewed and updated as appropriate: allergies, current medications, past family history, past medical history, past social history, past surgical history and problem list.  Past Medical History Past Medical History  Diagnosis Date  . Asthma     exercised induced  . Headache     menstral migraines  . Depression   . Migraine headache with aura     Past Surgical History Past Surgical History  Procedure Laterality Date  . Cesarean section N/A 2008  . Dilation and curettage of uterus N/A 2007    miossed ab  . Cholecystectomy N/A 05/16/2015    Procedure: LAPAROSCOPIC CHOLECYSTECTOMY WITH INTRAOPERATIVE CHOLANGIOGRAM;  Surgeon: Leonie Green, MD;  Location: [facility] ORS;  Service: General;  Laterality: N/A;  . Fracture surgery Left 2004    left index finger 3 screws  . Leep  2006    Gynecologic History No obstetric history on file.  Patient's last menstrual period was 02/17/2016 (exact date). Contraception: tubal ligation Last Pap: 2014. Results were: normal Last mammogram: NA.   Obstetric History OB History  No data available    Current Medications Current Outpatient Prescriptions on File Prior to Visit  Medication Sig Dispense Refill  . hydrOXYzine (VISTARIL) 25 MG capsule Take 1 capsule (25 mg total) by mouth every 6 (six) hours as needed. 20 capsule 3  . ondansetron (ZOFRAN) 4 MG tablet Take 4 mg by  mouth every 8 (eight) hours as needed for nausea or vomiting.    . rizatriptan (MAXALT) 10 MG tablet Take 10 mg by mouth as needed for migraine (have been trying to get PA on this med). May repeat in 2 hours if needed    . estradiol (VIVELLE-DOT) 0.1 MG/24HR patch Place 1 patch onto the skin. Reported on 03/02/2016    . naproxen (NAPROSYN) 500 MG tablet Take 1 tablet (500 mg total) by mouth 2 (two) times daily with a meal. As needed (Patient not taking: Reported on 03/02/2016) 40 tablet 0  . SUMAtriptan (IMITREX) 50 MG tablet Take 100 mg by mouth as needed for migraine. Reported on 03/02/2016     No current facility-administered medications on file prior to visit.    Review of Systems Patient denies any headaches, blurred vision, shortness of breath, chest pain, abdominal pain, problems with bowel movements, urination, or intercourse.  Objective:  BP 114/73 mmHg  Pulse 81  Ht 5\' 10"  (1.778 m)  Wt 204 lb 3.2 oz (92.625 kg)  BMI 29.30 kg/m2  LMP 02/17/2016 (Exact Date) Physical Exam  General:  Well developed, well nourished, no acute distress. She is alert and oriented x3. Skin:  Warm and dry Neck:  Midline trachea, no thyromegaly or nodules Cardiovascular: Regular rate and rhythm, no murmur heard Lungs:  Effort normal, all lung fields clear to auscultation bilaterally Breasts:  No dominant palpable mass, retraction, or nipple discharge Abdomen:  Soft, non tender, no hepatosplenomegaly or masses Pelvic:  External genitalia is normal in appearance.  The vagina is normal in appearance. The cervix is bulbous, no CMT.  Thin prep pap is done with HR HPV cotesting. Uterus is felt to be normal size, shape, and contour.  No adnexal masses or tenderness noted. Extremities:  No swelling or varicosities noted Psych:  She has a normal mood and affect  Assessment:   Healthy well-woman exam overweight  Plan:  Pap only F/U 1 year for AE, or sooner if needed Will return next week to restart weight  loss program.  Joylene Igo, CNM

## 2016-03-02 NOTE — Patient Instructions (Signed)
  Place annual gynecologic exam patient instructions here.  Thank you for enrolling in [facility]. Please follow the instructions below to securely access your online medical record. MyChart allows you to send messages to your doctor, view your test results, manage appointments, and more.   How Do I Sign Up? 1. In your Internet browser, go to [employer] and enter [email or web address]. 2. Click on the Sign Up Now link in the Sign In box. You will see the New Member Sign Up page. 3. Enter your MyChart Access Code exactly as it appears below. You will not need to use this code after you've completed the sign-up process. If you do not sign up before the expiration date, you must request a new code.  MyChart Access Code: [email or web address] Expires: 04/25/2016  9:44 AM  4. Enter your Social Security Number ([id number]) and Date of Birth (mm/dd/yyyy) as indicated and click Submit. You will be taken to the next sign-up page. 5. Create a MyChart ID. This will be your MyChart login ID and cannot be changed, so think of one that is secure and easy to remember. 6. Create a MyChart password. You can change your password at any time. 7. Enter your Password Reset Question and Answer. This can be used at a later time if you forget your password.  8. Enter your e-mail address. You will receive e-mail notification when new information is available in [place]. 9. Click Sign Up. You can now view your medical record.   Additional Information Remember, MyChart is NOT to be used for urgent needs. For medical emergencies, dial 911.

## 2016-03-03 ENCOUNTER — Ambulatory Visit (INDEPENDENT_AMBULATORY_CARE_PROVIDER_SITE_OTHER): Payer: BC Managed Care – PPO | Admitting: Obstetrics and Gynecology

## 2016-03-03 VITALS — BP 112/70 | HR 81 | Wt 204.0 lb

## 2016-03-03 DIAGNOSIS — E669 Obesity, unspecified: Secondary | ICD-10-CM

## 2016-03-03 MED ORDER — CYANOCOBALAMIN 1000 MCG/ML IJ SOLN
1000.0000 ug | Freq: Once | INTRAMUSCULAR | Status: AC
  Administered 2016-03-03: 1000 ug via INTRAMUSCULAR

## 2016-03-03 NOTE — Progress Notes (Signed)
Pt came in today to start Phentermine 37.5mg  Was given b-12 inj will return in 4 weeks for nurse visit

## 2016-03-04 LAB — CYTOLOGY - PAP

## 2016-03-05 ENCOUNTER — Other Ambulatory Visit: Payer: Self-pay | Admitting: *Deleted

## 2016-03-05 ENCOUNTER — Telehealth: Payer: Self-pay | Admitting: Obstetrics and Gynecology

## 2016-03-05 NOTE — Telephone Encounter (Signed)
im working on PA for this medication

## 2016-03-05 NOTE — Telephone Encounter (Signed)
PT NEEDS MAXALT SENT TO PHARMACY , SHE SAID THAT SHE WAS HERE THE OTHER DAY AND MEL WAS SUPPOSED TO SEND IT IN BUT ITS NOT AT [facility]

## 2016-04-27 ENCOUNTER — Other Ambulatory Visit: Payer: Self-pay | Admitting: Obstetrics and Gynecology

## 2016-04-27 ENCOUNTER — Telehealth: Payer: Self-pay | Admitting: Obstetrics and Gynecology

## 2016-04-27 MED ORDER — ALPRAZOLAM 0.5 MG PO TABS: 0.5000 mg | ORAL_TABLET | Freq: Three times a day (TID) | ORAL | Status: DC | PRN

## 2016-04-27 NOTE — Telephone Encounter (Signed)
Done-ac 

## 2016-04-27 NOTE — Telephone Encounter (Signed)
Please let her know it is ready for pick up

## 2016-04-27 NOTE — Telephone Encounter (Signed)
Regina Anderson said she talked to you about her anxiety. She said she can't deal with things anymore. She said she would like to try xanax now if you would send to pharmacy ([facility])

## 2016-07-14 ENCOUNTER — Other Ambulatory Visit: Payer: Self-pay | Admitting: Orthopedic Surgery

## 2016-07-14 DIAGNOSIS — M25552 Pain in left hip: Secondary | ICD-10-CM

## 2016-07-27 ENCOUNTER — Ambulatory Visit
Admission: RE | Admit: 2016-07-27 | Discharge: 2016-07-27 | Disposition: A | Discharge status: Home / Self Care | Payer: BC Managed Care – PPO | Source: Ambulatory Visit | Attending: Orthopedic Surgery | Admitting: Orthopedic Surgery

## 2016-07-27 ENCOUNTER — Ambulatory Visit: Payer: BC Managed Care – PPO

## 2016-07-27 DIAGNOSIS — M25552 Pain in left hip: Secondary | ICD-10-CM | POA: Diagnosis not present

## 2016-07-27 DIAGNOSIS — G8929 Other chronic pain: Secondary | ICD-10-CM | POA: Insufficient documentation

## 2016-07-30 ENCOUNTER — Telehealth: Payer: Self-pay | Admitting: Obstetrics and Gynecology

## 2016-07-30 NOTE — Telephone Encounter (Signed)
Patient had an mri ordered by orthopedics because she is having some hip/leg pain but on mri the hip was fine. There were cervical cysts seen on mri. Patient would like for you to look at the [facility] and let her know if she needs an appointment. Thanks

## 2016-08-03 NOTE — Telephone Encounter (Signed)
Please let her know I did not see anything on MRI that looked concerning. Cervical cysts are very common and come and go on there own. She does however have a uterine fibroid- also very common and not a concern-as long as her periods are not getting heavy from it.  I would recommend she have an ultrasound in a year to check size.

## 2016-08-03 NOTE — Telephone Encounter (Signed)
I spoke with the patient and she stated she is having a lot of pain in the groin area. She states the pain is unbearable at times. She can be reached at [phone].Thanks

## 2016-08-04 NOTE — Telephone Encounter (Signed)
Notified pt she voiced understanding 

## 2016-08-11 ENCOUNTER — Ambulatory Visit (INDEPENDENT_AMBULATORY_CARE_PROVIDER_SITE_OTHER): Payer: BC Managed Care – PPO | Admitting: Obstetrics and Gynecology

## 2016-08-11 ENCOUNTER — Encounter: Payer: Self-pay | Admitting: Obstetrics and Gynecology

## 2016-08-11 VITALS — BP 91/52 | HR 85 | Ht 69.0 in | Wt 192.3 lb

## 2016-08-11 DIAGNOSIS — R1030 Lower abdominal pain, unspecified: Secondary | ICD-10-CM

## 2016-08-11 DIAGNOSIS — N888 Other specified noninflammatory disorders of cervix uteri: Secondary | ICD-10-CM | POA: Diagnosis not present

## 2016-08-11 DIAGNOSIS — R1032 Left lower quadrant pain: Secondary | ICD-10-CM

## 2016-08-11 NOTE — Progress Notes (Signed)
Subjective:     Patient ID: Regina Anderson, female   DOB: April 15, 1981, 35 y.o.   MRN: [id number]  HPI See phone calls.  Review of Systems Left groin pain x 1 year, worse with running and after sex in certain positions.  Saw orthopedics and cleared.    Objective:   Physical Exam A&O x4  well groomed female in no distress Blood pressure (!) 91/52, pulse 85, height 5\' 9"  (1.753 m), weight 192 lb 4.8 oz (87.2 kg), last menstrual period 07/27/2016. Abdomen soft and nontender Left groin non-tender without lymphadenopathy Pelvis titled with left anterior     Assessment:     Left groin pain   Nabothian cysts Plan:     Reassured and recommended chiropractor adjustment. RTC prn Alantis Bethune [place], CNM

## 2016-11-17 ENCOUNTER — Ambulatory Visit (INDEPENDENT_AMBULATORY_CARE_PROVIDER_SITE_OTHER): Payer: BC Managed Care – PPO

## 2016-11-17 DIAGNOSIS — Z23 Encounter for immunization: Secondary | ICD-10-CM

## 2016-12-08 ENCOUNTER — Other Ambulatory Visit: Payer: Self-pay | Admitting: *Deleted

## 2016-12-08 MED ORDER — RIZATRIPTAN BENZOATE 10 MG PO TABS: 10.0000 mg | ORAL_TABLET | ORAL | 2 refills | Status: DC | PRN

## 2017-03-03 ENCOUNTER — Encounter: Payer: BC Managed Care – PPO | Admitting: Obstetrics and Gynecology

## 2017-05-05 ENCOUNTER — Ambulatory Visit (INDEPENDENT_AMBULATORY_CARE_PROVIDER_SITE_OTHER): Payer: BC Managed Care – PPO | Admitting: Obstetrics and Gynecology

## 2017-05-05 ENCOUNTER — Encounter: Payer: Self-pay | Admitting: Obstetrics and Gynecology

## 2017-05-05 VITALS — BP 116/79 | HR 71 | Ht 70.0 in | Wt 204.6 lb

## 2017-05-05 DIAGNOSIS — Z01419 Encounter for gynecological examination (general) (routine) without abnormal findings: Secondary | ICD-10-CM

## 2017-05-05 MED ORDER — ESTRADIOL 0.1 MG/24HR TD PTTW: 1.0000 | MEDICATED_PATCH | TRANSDERMAL | 4 refills | Status: DC

## 2017-05-05 MED ORDER — CYANOCOBALAMIN 1000 MCG/ML IJ SOLN: 1000.0000 ug | INTRAMUSCULAR | 1 refills | Status: DC

## 2017-05-05 MED ORDER — RIZATRIPTAN BENZOATE 10 MG PO TABS: 10.0000 mg | ORAL_TABLET | ORAL | 2 refills | Status: DC | PRN

## 2017-05-05 MED ORDER — BUTALBITAL-APAP-CAFF-COD 50-325-40-30 MG PO CAPS: 1.0000 | ORAL_CAPSULE | ORAL | 0 refills | Status: DC | PRN

## 2017-05-05 NOTE — Patient Instructions (Signed)
  Place annual gynecologic exam patient instructions here.  Thank you for enrolling in [facility]. Please follow the instructions below to securely access your online medical record. MyChart allows you to send messages to your doctor, view your test results, manage appointments, and more.   How Do I Sign Up? 1. In your Internet browser, go to [employer] and enter [email or web address]. 2. Click on the Sign Up Now link in the Sign In box. You will see the New Member Sign Up page. 3. Enter your MyChart Access Code exactly as it appears below. You will not need to use this code after you've completed the sign-up process. If you do not sign up before the expiration date, you must request a new code.  MyChart Access Code: [email or web address] Expires: 07/04/2017 11:14 AM  4. Enter your Social Security Number ([id number]) and Date of Birth (mm/dd/yyyy) as indicated and click Submit. You will be taken to the next sign-up page. 5. Create a MyChart ID. This will be your MyChart login ID and cannot be changed, so think of one that is secure and easy to remember. 6. Create a MyChart password. You can change your password at any time. 7. Enter your Password Reset Question and Answer. This can be used at a later time if you forget your password.  8. Enter your e-mail address. You will receive e-mail notification when new information is available in [place]. 9. Click Sign Up. You can now view your medical record.   Additional Information Remember, MyChart is NOT to be used for urgent needs. For medical emergencies, dial 911.

## 2017-05-05 NOTE — Progress Notes (Signed)
Subjective:   Regina Anderson is a 36 y.o. G2P0 Caucasian female here for a routine well-woman exam.  Patient's last menstrual period was 04/19/2017.    Current complaints: none, having less headaches since using dolterra essential oils, and desires B12 for energy. PCP: Lada       does desire labs  Social History: Sexual: heterosexual Marital Status: married Living situation: with family Occupation: Pharmacist, hospital Tobacco/alcohol: no tobacco use Illicit drugs: no history of illicit drug use  The following portions of the patient's history were reviewed and updated as appropriate: allergies, current medications, past family history, past medical history, past social history, past surgical history and problem list.  Past Medical History Past Medical History:  Diagnosis Date  . Asthma    exercised induced  . Depression   . Headache    menstral migraines  . Migraine headache with aura     Past Surgical History Past Surgical History:  Procedure Laterality Date  . CESAREAN SECTION N/A 2008  . CHOLECYSTECTOMY N/A 05/16/2015   Procedure: LAPAROSCOPIC CHOLECYSTECTOMY WITH INTRAOPERATIVE CHOLANGIOGRAM;  Surgeon: Leonie Green, MD;  Location: [facility] ORS;  Service: General;  Laterality: N/A;  . DILATION AND CURETTAGE OF UTERUS N/A 2007   miossed ab  . FRACTURE SURGERY Left 2004   left index finger 3 screws  . LEEP  2006    Gynecologic History G2P0  Patient's last menstrual period was 04/19/2017. Contraception: tubal ligation Last Pap: 2017. Results were: normal Last mammogram: 2014 Results were: normal   Obstetric History OB History  Gravida Para Term Preterm AB Living  2            SAB TAB Ectopic Multiple Live Births               # Outcome Date GA Lbr Len/2nd Weight Sex Delivery Anes PTL Lv  2 Gravida 2008    M CS-Unspec     1 Gravida 2001    F Vag-Spont         Current Medications Current Outpatient Prescriptions on File Prior to Visit  Medication Sig Dispense Refill   . rizatriptan (MAXALT) 10 MG tablet Take 1 tablet (10 mg total) by mouth as needed for migraine (have been trying to get PA on this med). May repeat in 2 hours if needed 10 tablet 2  . butalbital-acetaminophen-caffeine (FIORICET WITH CODEINE) 50-325-40-30 MG capsule Take 1 capsule by mouth every 4 (four) hours as needed for headache.    . estradiol (VIVELLE-DOT) 0.1 MG/24HR patch Place 1 patch onto the skin. Reported on 03/02/2016    . naproxen (NAPROSYN) 500 MG tablet Take 1 tablet (500 mg total) by mouth 2 (two) times daily with a meal. As needed (Patient not taking: Reported on 03/02/2016) 40 tablet 0   No current facility-administered medications on file prior to visit.     Review of Systems Patient denies any headaches, blurred vision, shortness of breath, chest pain, abdominal pain, problems with bowel movements, urination, or intercourse.  Objective:  BP 116/79   Pulse 71   Ht 5\' 10"  (1.778 m)   Wt 204 lb 9.6 oz (92.8 kg)   LMP 04/19/2017   BMI 29.36 kg/m  Physical Exam  General:  Well developed, well nourished, no acute distress. She is alert and oriented x3. Skin:  Warm and dry Neck:  Midline trachea, no thyromegaly or nodules Cardiovascular: Regular rate and rhythm, no murmur heard Lungs:  Effort normal, all lung fields clear to auscultation bilaterally Breasts:  No  dominant palpable mass, retraction, or nipple discharge Abdomen:  Soft, non tender, no hepatosplenomegaly or masses Pelvic:  External genitalia is normal in appearance.  The vagina is normal in appearance. The cervix is bulbous, no CMT.  Thin prep pap is not done. Uterus is felt to be normal size, shape, and contour.  No adnexal masses or tenderness noted. Extremities:  No swelling or varicosities noted Psych:  She has a normal mood and affect  Assessment:   Healthy well-woman exam overweight  Plan:  meds refilled and routine screening labs.  F/U 1 year for Ae, or sooner if needed Mammogram NA  Melody Rockney [place], CNM

## 2017-05-06 LAB — COMPREHENSIVE METABOLIC PANEL
ALBUMIN: 4.5 g/dL (ref 3.5–5.5)
ALT: 17 IU/L (ref 0–32)
AST: 22 IU/L (ref 0–40)
Albumin/Globulin Ratio: 1.5 (ref 1.2–2.2)
Alkaline Phosphatase: 78 IU/L (ref 39–117)
BILIRUBIN TOTAL: 0.5 mg/dL (ref 0.0–1.2)
BUN / CREAT RATIO: 13 (ref 9–23)
BUN: 9 mg/dL (ref 6–20)
CHLORIDE: 100 mmol/L (ref 96–106)
CO2: 24 mmol/L (ref 18–29)
Calcium: 9.1 mg/dL (ref 8.7–10.2)
Creatinine, Ser: 0.7 mg/dL (ref 0.57–1.00)
GFR calc non Af Amer: 111 mL/min/{1.73_m2} (ref >59)
GFR, EST AFRICAN AMERICAN: 128 mL/min/{1.73_m2} (ref >59)
GLUCOSE: 85 mg/dL (ref 65–99)
Globulin, Total: 3 g/dL (ref 1.5–4.5)
Potassium: 4.7 mmol/L (ref 3.5–5.2)
Sodium: 140 mmol/L (ref 134–144)
TOTAL PROTEIN: 7.5 g/dL (ref 6.0–8.5)

## 2017-05-06 LAB — LIPID PANEL
Chol/HDL Ratio: 2.2 ratio (ref 0.0–4.4)
Cholesterol, Total: 171 mg/dL (ref 100–199)
HDL: 77 mg/dL (ref >39)
LDL Calculated: 80 mg/dL (ref 0–99)
TRIGLYCERIDES: 70 mg/dL (ref 0–149)
VLDL Cholesterol Cal: 14 mg/dL (ref 5–40)

## 2017-05-11 ENCOUNTER — Telehealth: Payer: Self-pay | Admitting: *Deleted

## 2017-05-11 NOTE — Telephone Encounter (Signed)
-----   Message from Joylene Igo, [place] sent at 05/06/2017 12:41 PM EDT ----- Labs are normal

## 2017-05-11 NOTE — Telephone Encounter (Signed)
Mailed all labs to pt 

## 2017-07-22 ENCOUNTER — Ambulatory Visit: Payer: BC Managed Care – PPO | Admitting: Podiatry

## 2017-10-15 ENCOUNTER — Other Ambulatory Visit: Payer: Self-pay | Admitting: Obstetrics and Gynecology

## 2017-10-18 ENCOUNTER — Ambulatory Visit (INDEPENDENT_AMBULATORY_CARE_PROVIDER_SITE_OTHER): Payer: BC Managed Care – PPO

## 2017-10-18 DIAGNOSIS — Z23 Encounter for immunization: Secondary | ICD-10-CM

## 2018-03-23 DIAGNOSIS — M19011 Primary osteoarthritis, right shoulder: Secondary | ICD-10-CM | POA: Diagnosis not present

## 2018-03-26 DIAGNOSIS — B9689 Other specified bacterial agents as the cause of diseases classified elsewhere: Secondary | ICD-10-CM | POA: Diagnosis not present

## 2018-03-26 DIAGNOSIS — J019 Acute sinusitis, unspecified: Secondary | ICD-10-CM | POA: Diagnosis not present

## 2018-03-30 ENCOUNTER — Other Ambulatory Visit: Payer: Self-pay | Admitting: Obstetrics and Gynecology

## 2018-04-22 DIAGNOSIS — M25511 Pain in right shoulder: Secondary | ICD-10-CM | POA: Diagnosis not present

## 2018-04-25 DIAGNOSIS — M25511 Pain in right shoulder: Secondary | ICD-10-CM | POA: Diagnosis not present

## 2018-04-30 ENCOUNTER — Other Ambulatory Visit: Payer: Self-pay | Admitting: Obstetrics and Gynecology

## 2018-05-18 ENCOUNTER — Encounter: Payer: BC Managed Care – PPO | Admitting: Obstetrics and Gynecology

## 2018-05-31 DIAGNOSIS — G8918 Other acute postprocedural pain: Secondary | ICD-10-CM | POA: Diagnosis not present

## 2018-05-31 DIAGNOSIS — M7551 Bursitis of right shoulder: Secondary | ICD-10-CM | POA: Diagnosis not present

## 2018-05-31 DIAGNOSIS — M24111 Other articular cartilage disorders, right shoulder: Secondary | ICD-10-CM | POA: Diagnosis not present

## 2018-05-31 DIAGNOSIS — M7541 Impingement syndrome of right shoulder: Secondary | ICD-10-CM | POA: Diagnosis not present

## 2018-06-06 ENCOUNTER — Other Ambulatory Visit: Payer: Self-pay

## 2018-06-06 ENCOUNTER — Ambulatory Visit: Payer: BLUE CROSS/BLUE SHIELD | Attending: Orthopedic Surgery

## 2018-06-06 DIAGNOSIS — M24111 Other articular cartilage disorders, right shoulder: Secondary | ICD-10-CM | POA: Diagnosis not present

## 2018-06-06 DIAGNOSIS — M25511 Pain in right shoulder: Secondary | ICD-10-CM | POA: Insufficient documentation

## 2018-06-06 DIAGNOSIS — R29898 Other symptoms and signs involving the musculoskeletal system: Secondary | ICD-10-CM | POA: Diagnosis not present

## 2018-06-06 DIAGNOSIS — M25611 Stiffness of right shoulder, not elsewhere classified: Secondary | ICD-10-CM | POA: Diagnosis not present

## 2018-06-06 NOTE — Therapy (Signed)
[facility] [address] [place], [address] Phone: [phone]   Fax:  [phone]  Physical Therapy Evaluation  Patient Details  Name: Regina Anderson MRN: [id number] Date of Birth: 04-27-1981 No data recorded  Encounter Date: 06/06/2018  PT End of Session - 06/06/18 1026    Visit Number  1    Number of Visits  13    Date for PT Re-Evaluation  07/18/18    Authorization Type  1/13    PT Start Time  0845    PT Stop Time  0945    PT Time Calculation (min)  60 min    Activity Tolerance  Patient tolerated treatment well    Behavior During Therapy  [facility] for tasks assessed/performed       Past Medical History:  Diagnosis Date  . Asthma    exercised induced  . Depression   . Headache    menstral migraines  . Migraine headache with aura     Past Surgical History:  Procedure Laterality Date  . CESAREAN SECTION N/A 2008  . CHOLECYSTECTOMY N/A 05/16/2015   Procedure: LAPAROSCOPIC CHOLECYSTECTOMY WITH INTRAOPERATIVE CHOLANGIOGRAM;  Surgeon: Leonie Green, MD;  Location: [facility] ORS;  Service: General;  Laterality: N/A;  . DILATION AND CURETTAGE OF UTERUS N/A 2007   miossed ab  . FRACTURE SURGERY Left 2004   left index finger 3 screws  . LEEP  2006    There were no vitals filed for this visit.   Subjective Assessment - 06/06/18 1014    Subjective  Pt reports that she had R shoulder pain for the past 6 months and had decompression surgery on 6/5. Pt has been doing pedulum exercise at home per MD order. Pt reports that she "overdid it this weekend and is paying for it today". Pt also reported that she is unable to blowdry her harir but was able to wash her hair for the first time yesterday. Pt is seeing MD after today's visit. Prior to surgery, pt stated that she was doing everything just with pain and range of motion limitations. Pt reports no MOI leading to shoulder pain.     Limitations  House hold  activities;Lifting    Diagnostic tests  R shoulder decompression surgery     Currently in Pain?  Yes    Pain Score  6     Pain Location  Shoulder    Pain Orientation  Right    Pain Descriptors / Indicators  Aching    Pain Type  Acute pain    Pain Onset  In the past 7 days    Pain Frequency  Constant         [facility] PT Assessment - 06/06/18 0855      Assessment   Medical Diagnosis  shoulder decompression    Onset Date/Surgical Date  05/31/18    Hand Dominance  Right    Next MD Visit  06/06/2018    Prior Therapy  PT for "crushed knuckle a few years ago"      Restrictions   Other Position/Activity Restrictions  Per pt report, MD said not to do activities that require lifitng arm overhead       Balance Screen   Has the patient fallen in the past 6 months  No    Has the patient had a decrease in activity level because of a fear of falling?   Yes    Is the patient reluctant to leave  their home because of a fear of falling?   No      Prior Function   Level of Independence  Independent    Vocation  Full time employment    Vocation Requirements  heavy lifting      Cognition   Overall Cognitive Status  Within Functional Limits for tasks assessed      Observation/Other Assessments   Skin Integrity  Surgical site examined, no signs of infection. stitches present.    Focus on Therapeutic Outcomes (FOTO)   48      Posture/Postural Control   Posture Comments  Forward head posture      ROM / Strength   AROM / PROM / Strength  AROM;Strength;PROM      AROM   Overall AROM   Deficits    AROM Assessment Site  Shoulder    Right/Left Shoulder  Right    Right Shoulder Flexion  80 Degrees    Right Shoulder ABduction  87 Degrees    Right Shoulder Internal Rotation  35 Degrees    Right Shoulder External Rotation  90 Degrees      PROM   Overall PROM   Deficits    Right/Left Shoulder  Right    Right Shoulder Flexion  124 Degrees    Right Shoulder ABduction  117 Degrees    Right  Shoulder Internal Rotation  75 Degrees    Right Shoulder External Rotation  90 Degrees      Strength   Overall Strength Comments  Strenght assessed isometrically in neutral position, good muscle activation. (4+/5)      Palpation   Palpation comment  Increased tenderness along the Upper trapezius          Objective measurements completed on examination: See above findings.    TREATMENT Therapeutic Exercises 4 way isometric (IR, ER, flex, ext) 5s hold x10 Pendulum 30sx2 Pulley AAROM x20 performed B   Patient demonstrates improvement in shoulder flexion at the end of the session  No pain reported with isometric exercises, all exercises tolerated well.     PT Education - 06/06/18 1025    Education Details  Patient educated on plan of care and proper form/technique for therapetuic exercises and HEP.    Person(s) Educated  Patient    Methods  Explanation;Handout;Demonstration    Comprehension  Verbalized understanding;Returned demonstration          PT Long Term Goals - 06/06/18 1042      PT LONG TERM GOAL #1   Title  Patient will improve FOTO score from 48 to 73 in order to perform ADLS and demonstrate an improvement in function and     Baseline  48    Time  6    Period  Weeks    Status  New    Target Date  07/18/18      PT LONG TERM GOAL #2   Title  Pt will report worst pain in past week less than 5/10 in order to complete ADLs and household chores and progress towards returning to PLOF.    Baseline  10/10    Time  4    Period  Weeks    Status  New    Target Date  07/04/18      PT LONG TERM GOAL #3   Title  Pt will report no pain with ADLs and household chores in order to return to prior level of function.    Time  6    Period  Weeks  Status  New    Target Date  07/18/18      PT LONG TERM GOAL #4   Title  Pt will demonstrate full, painfree, AROM in R shoulder in order to reach into overhead shelf and complete vocational requirements.     Time  6     Period  Weeks    Status  New    Target Date  07/18/18      PT LONG TERM GOAL #5   Title  Pt will be able to lift 20# box to waist height, pain free, in order to complete lifting required for work..    Time  6    Period  Weeks    Status  New    Target Date  07/18/18             Plan - 06/06/18 1027    Clinical Impression Statement  Patient is 37 y.o R hand dominant female that presents s/p R shoulder arthroscopic decompression surgery. Pt reports that current pain is 6/10 and the worst pain in the past week was 10/10 after the nerve block wore off s/p surgery. Pt presents with AROM and PROM deficits as well as strength deficits in R shoulder (see flowsheet). Pt scored 48 on the FOTO demonstrating a signifcant negative impact on current function. Pt is unable to complete all ADLs independently and requires assistance from family memebrs for daily tasks suh as dressing and bathing. Prior to surgery, patient had difficulty with lifting, pushing , and reaching behind her back. Patient will benefit from skilled PT in to address deficits listed above and to return to prior level of function.    Clinical Presentation  Evolving    Clinical Decision Making  Moderate    Rehab Potential  Good    Clinical Impairments Affecting Rehab Potential  motivated, family support     PT Frequency  2x / week    PT Duration  6 weeks    PT Treatment/Interventions  ADLs/Self Care Home Management;Aquatic Therapy;Cryotherapy;Moist Heat;Therapeutic activities;Functional mobility training;Therapeutic exercise;Neuromuscular re-education;Patient/family education;Manual techniques;Compression bandaging;Passive range of motion;Dry needling    PT Next Visit Plan  review HEP exercises, add isometric exercises, AROM/PROM/AAROM    PT Home Exercise Plan  4 way isometircs against wall, pulleys    Consulted and Agree with Plan of Care  Patient       Patient will benefit from skilled therapeutic intervention in order to  improve the following deficits and impairments:  Decreased range of motion, Decreased strength  Visit Diagnosis: Acute pain of right shoulder  Decreased ROM of right shoulder  Impaired strength of shoulder muscles     Problem List Patient Active Problem List   Diagnosis Date Noted  . Excessive drinking of alcohol 07/25/2015  . Adjustment disorder with depressed mood 07/25/2015  . Family history of diverticulitis of colon 07/25/2015  . Depression   . Migraine headache with aura     Bobbe Medico 06/06/2018, 1:24 PM  [facility] [address] [place], [address] Phone: [phone]   Fax:  [phone]  Name: ZAKYA HALABI MRN: [id number] Date of Birth: 1981-04-16

## 2018-06-13 ENCOUNTER — Ambulatory Visit: Payer: BLUE CROSS/BLUE SHIELD

## 2018-06-15 ENCOUNTER — Ambulatory Visit: Payer: BLUE CROSS/BLUE SHIELD

## 2018-06-19 ENCOUNTER — Ambulatory Visit: Payer: BLUE CROSS/BLUE SHIELD

## 2018-06-19 DIAGNOSIS — R29898 Other symptoms and signs involving the musculoskeletal system: Secondary | ICD-10-CM | POA: Diagnosis not present

## 2018-06-19 DIAGNOSIS — M25611 Stiffness of right shoulder, not elsewhere classified: Secondary | ICD-10-CM | POA: Diagnosis not present

## 2018-06-19 DIAGNOSIS — M25511 Pain in right shoulder: Secondary | ICD-10-CM | POA: Diagnosis not present

## 2018-06-19 NOTE — Therapy (Signed)
[facility] [address] [place], [address] Phone: [phone]   Fax:  [phone]  Physical Therapy Treatment  Patient Details  Name: Regina Anderson MRN: [id number] Date of Birth: 11/27/1981 No data recorded  Encounter Date: 06/19/2018  PT End of Session - 06/19/18 0819    Visit Number  2    Number of Visits  13    Date for PT Re-Evaluation  07/18/18    Authorization Type  2/13    PT Start Time  0732    PT Stop Time  0817    PT Time Calculation (min)  45 min    Activity Tolerance  Patient tolerated treatment well    Behavior During Therapy  [facility] for tasks assessed/performed       Past Medical History:  Diagnosis Date  . Asthma    exercised induced  . Depression   . Headache    menstral migraines  . Migraine headache with aura     Past Surgical History:  Procedure Laterality Date  . CESAREAN SECTION N/A 2008  . CHOLECYSTECTOMY N/A 05/16/2015   Procedure: LAPAROSCOPIC CHOLECYSTECTOMY WITH INTRAOPERATIVE CHOLANGIOGRAM;  Surgeon: Leonie Green, MD;  Location: [facility] ORS;  Service: General;  Laterality: N/A;  . DILATION AND CURETTAGE OF UTERUS N/A 2007   miossed ab  . FRACTURE SURGERY Left 2004   left index finger 3 screws  . LEEP  2006    There were no vitals filed for this visit.  Subjective Assessment - 06/19/18 0732    Subjective  Pt reports that shoulder has been feeling "fine". Pt reports that she has not done HEP. Pt also states that she has been to the gym and has done some light exercises with R UE (1# dumbbell and elastic band). Pt reports that she has been having trouble sleeping and feels tightness in anterior shoulder musculature. Pt also states that she is going back to work and is still not heavy lifting.     Limitations  House hold activities;Lifting    Diagnostic tests  R shoulder decompression surgery     Currently in Pain?  No/denies    Pain Score  0-No pain         TREATMENT Therapeutic Exercises Pulleys flexion x20, abduction x20  Corner pec stretch 30s x5 verbal cues require for proper positioning Shoulder flexion with yellow tube x30 Shoulder abduction with yellow tube x30 Shoulder ER "no moneys" aka "W exercise" with yellow tubing x30 Shoulder IR with yellow tubing x30  Pt tolerated treatment well, no increased pain with therapeutic exercises. Pt also educated on proper sleeping position to support shoulder (sidelying with pillow underneath arm).    PT Education - 06/19/18 0818    Education Details  Patient educated on proper form and technique for therapeutic exercises. Patient also educated on not overdoig it in the gym and to optimize strengthening in full ROM before increasing weight. Additionally, pt educated on sleeping position to support shoulder in sidelying.    Person(s) Educated  Patient    Methods  Explanation    Comprehension  Verbalized understanding;Returned demonstration          PT Long Term Goals - 06/06/18 1042      PT LONG TERM GOAL #1   Title  Patient will improve FOTO score from 48 to 73 in order to perform ADLS and demonstrate an improvement in function and     Baseline  48  Time  6    Period  Weeks    Status  New    Target Date  07/18/18      PT LONG TERM GOAL #2   Title  Pt will report worst pain in past week less than 5/10 in order to complete ADLs and household chores and progress towards returning to PLOF.    Baseline  10/10    Time  4    Period  Weeks    Status  New    Target Date  07/04/18      PT LONG TERM GOAL #3   Title  Pt will report no pain with ADLs and household chores in order to return to prior level of function.    Time  6    Period  Weeks    Status  New    Target Date  07/18/18      PT LONG TERM GOAL #4   Title  Pt will demonstrate full, painfree, AROM in R shoulder in order to reach into overhead shelf and complete vocational requirements.     Time  6    Period  Weeks     Status  New    Target Date  07/18/18      PT LONG TERM GOAL #5   Title  Pt will be able to lift 20# box to waist height, pain free, in order to complete lifting required for work..    Time  6    Period  Weeks    Status  New    Target Date  07/18/18            Plan - 06/19/18 0820    Clinical Impression Statement  Patient demonstrates decreased R shoulder ROM and strength. Pt requires verbal cues for proper form with therapeutic exercises. Increased pain while going into shoulder abduction. Overall, patients AROM has improved significantly since last visit. Pt will continue to benefit from skilled PT in order to return to prior level of function.    Rehab Potential  Good    Clinical Impairments Affecting Rehab Potential  motivated, family support     PT Frequency  2x / week    PT Duration  6 weeks    PT Treatment/Interventions  ADLs/Self Care Home Management;Aquatic Therapy;Cryotherapy;Moist Heat;Therapeutic activities;Functional mobility training;Therapeutic exercise;Neuromuscular re-education;Patient/family education;Manual techniques;Compression bandaging;Passive range of motion;Dry needling    PT Next Visit Plan  increase band resistance    PT Home Exercise Plan  see education    Consulted and Agree with Plan of Care  Patient       Patient will benefit from skilled therapeutic intervention in order to improve the following deficits and impairments:  Decreased range of motion, Decreased strength  Visit Diagnosis: Acute pain of right shoulder  Decreased ROM of right shoulder  Impaired strength of shoulder muscles     Problem List Patient Active Problem List   Diagnosis Date Noted  . Excessive drinking of alcohol 07/25/2015  . Adjustment disorder with depressed mood 07/25/2015  . Family history of diverticulitis of colon 07/25/2015  . Depression   . Migraine headache with aura     Georg Ruddle, SPT 06/19/2018, 8:27 AM  [facility] [address], [place], [address] Phone: [phone]   Fax:  [phone]  Name: Regina Anderson MRN: [id number] Date of Birth: 25-Aug-1981

## 2018-06-21 ENCOUNTER — Ambulatory Visit: Payer: BLUE CROSS/BLUE SHIELD

## 2018-06-28 ENCOUNTER — Ambulatory Visit: Payer: BLUE CROSS/BLUE SHIELD | Attending: Orthopedic Surgery

## 2018-06-28 DIAGNOSIS — M25511 Pain in right shoulder: Secondary | ICD-10-CM | POA: Diagnosis not present

## 2018-06-28 DIAGNOSIS — R29898 Other symptoms and signs involving the musculoskeletal system: Secondary | ICD-10-CM | POA: Diagnosis not present

## 2018-06-28 DIAGNOSIS — M25611 Stiffness of right shoulder, not elsewhere classified: Secondary | ICD-10-CM | POA: Insufficient documentation

## 2018-06-28 NOTE — Therapy (Signed)
[facility] [address], [place], [address] Phone: [phone]   Fax:  [phone]  Physical Therapy Treatment  Patient Details  Name: Regina Anderson MRN: [id number] Date of Birth: June 11, 1981 No data recorded  Encounter Date: 06/28/2018  PT End of Session - 06/28/18 1805    Visit Number  3    Number of Visits  14    Date for PT Re-Evaluation  07/18/18    Authorization Type  3/13    PT Start Time  4496    PT Stop Time  1800    PT Time Calculation (min)  45 min    Activity Tolerance  Patient tolerated treatment well    Behavior During Therapy  [facility] for tasks assessed/performed       Past Medical History:  Diagnosis Date  . Asthma    exercised induced  . Depression   . Headache    menstral migraines  . Migraine headache with aura     Past Surgical History:  Procedure Laterality Date  . CESAREAN SECTION N/A 2008  . CHOLECYSTECTOMY N/A 05/16/2015   Procedure: LAPAROSCOPIC CHOLECYSTECTOMY WITH INTRAOPERATIVE CHOLANGIOGRAM;  Surgeon: Leonie Green, MD;  Location: [facility] ORS;  Service: General;  Laterality: N/A;  . DILATION AND CURETTAGE OF UTERUS N/A 2007   miossed ab  . FRACTURE SURGERY Left 2004   left index finger 3 screws  . LEEP  2006    There were no vitals filed for this visit.  Subjective Assessment - 06/28/18 1803    Subjective  Pt reports that shoulder is still feeling "fine" but that she is continuing to have trouble sleeping. Pt has been doing exercises with TB at home and at the gym. Pt goes to MD next week for follow up.    Limitations  House hold activities;Lifting    Diagnostic tests  R shoulder decompression surgery     Currently in Pain?  No/denies          TREATMENT Therapeutic Exercises Corner pec stretch 30s x5, verbal cues for proper form  Shoulder flexion with red TB x20  Shoulder abductio with RTB n x20  Shoulder ER "no moneys" aka "w exercise" with red TB  x20 Retraction with shoulder at 90 deg abd + ER with yellow TB x20 Wall push ups 10x3  Shoulder extension with GTB 15x2 Push up plus on wall x20 Punch plus with yellow TB x20  Patient does not report any increased pain throughout therapeutic exercises.       PT Education - 06/28/18 1804    Education Details  Patient educated on proper form and technique for therapeutic exercises.     Person(s) Educated  Patient    Methods  Explanation    Comprehension  Verbalized understanding;Returned demonstration          PT Long Term Goals - 06/06/18 1042      PT LONG TERM GOAL #1   Title  Patient will improve FOTO score from 48 to 73 in order to perform ADLS and demonstrate an improvement in function and     Baseline  48    Time  6    Period  Weeks    Status  New    Target Date  07/18/18      PT LONG TERM GOAL #2   Title  Pt will report worst pain in past week less than 5/10 in order to complete ADLs and household chores and progress  towards returning to PLOF.    Baseline  10/10    Time  4    Period  Weeks    Status  New    Target Date  07/04/18      PT LONG TERM GOAL #3   Title  Pt will report no pain with ADLs and household chores in order to return to prior level of function.    Time  6    Period  Weeks    Status  New    Target Date  07/18/18      PT LONG TERM GOAL #4   Title  Pt will demonstrate full, painfree, AROM in R shoulder in order to reach into overhead shelf and complete vocational requirements.     Time  6    Period  Weeks    Status  New    Target Date  07/18/18      PT LONG TERM GOAL #5   Title  Pt will be able to lift 20# box to waist height, pain free, in order to complete lifting required for work..    Time  6    Period  Weeks    Status  New    Target Date  07/18/18            Plan - 06/28/18 1806    Clinical Impression Statement  Overall, patients shoulder ROM and strength have improved. Pt is now able to use GTB for therapetic exercises  with no increase in pain. Pt demonstrated improved tolerance to higher level therapeutic exercises (wall push up, punch plus). Pt will continue to benefit from skilled PT in order t oreturn t oprior level of function.    Rehab Potential  Good    Clinical Impairments Affecting Rehab Potential  motivated, family support     PT Frequency  2x / week    PT Duration  6 weeks    PT Treatment/Interventions  ADLs/Self Care Home Management;Aquatic Therapy;Cryotherapy;Moist Heat;Therapeutic activities;Functional mobility training;Therapeutic exercise;Neuromuscular re-education;Patient/family education;Manual techniques;Compression bandaging;Passive range of motion;Dry needling    PT Next Visit Plan  review new exercises added last session, push up from lower surface, increase resistance     PT Home Exercise Plan  see education    Consulted and Agree with Plan of Care  Patient       Patient will benefit from skilled therapeutic intervention in order to improve the following deficits and impairments:  Decreased range of motion, Decreased strength  Visit Diagnosis: Acute pain of right shoulder  Decreased ROM of right shoulder  Impaired strength of shoulder muscles     Problem List Patient Active Problem List   Diagnosis Date Noted  . Excessive drinking of alcohol 07/25/2015  . Adjustment disorder with depressed mood 07/25/2015  . Family history of diverticulitis of colon 07/25/2015  . Depression   . Migraine headache with aura     Georg Ruddle, SPT 06/28/2018, 6:08 PM  [facility] [address] [place], [address] Phone: [phone]   Fax:  [phone]  Name: Regina Anderson MRN: [id number] Date of Birth: March 14, 1981

## 2018-07-04 ENCOUNTER — Ambulatory Visit: Payer: BLUE CROSS/BLUE SHIELD

## 2018-07-04 DIAGNOSIS — M24111 Other articular cartilage disorders, right shoulder: Secondary | ICD-10-CM | POA: Diagnosis not present

## 2018-07-05 ENCOUNTER — Ambulatory Visit: Payer: BLUE CROSS/BLUE SHIELD

## 2018-07-07 ENCOUNTER — Other Ambulatory Visit: Payer: Self-pay | Admitting: Obstetrics and Gynecology

## 2018-07-10 ENCOUNTER — Encounter: Payer: Self-pay | Admitting: Family Medicine

## 2018-07-10 ENCOUNTER — Ambulatory Visit: Payer: BLUE CROSS/BLUE SHIELD

## 2018-07-12 ENCOUNTER — Ambulatory Visit: Payer: BLUE CROSS/BLUE SHIELD

## 2018-07-17 ENCOUNTER — Ambulatory Visit: Payer: BLUE CROSS/BLUE SHIELD

## 2018-07-19 ENCOUNTER — Encounter

## 2018-07-19 ENCOUNTER — Ambulatory Visit: Payer: BLUE CROSS/BLUE SHIELD

## 2018-07-19 ENCOUNTER — Encounter: Payer: Self-pay | Admitting: Obstetrics and Gynecology

## 2018-08-11 ENCOUNTER — Encounter: Payer: Self-pay | Admitting: Obstetrics and Gynecology

## 2018-08-18 ENCOUNTER — Encounter: Payer: Self-pay | Admitting: Obstetrics and Gynecology

## 2018-08-18 ENCOUNTER — Ambulatory Visit (INDEPENDENT_AMBULATORY_CARE_PROVIDER_SITE_OTHER): Payer: BLUE CROSS/BLUE SHIELD | Admitting: Obstetrics and Gynecology

## 2018-08-18 VITALS — BP 92/55 | HR 68 | Ht 70.0 in | Wt 210.0 lb

## 2018-08-18 DIAGNOSIS — G43119 Migraine with aura, intractable, without status migrainosus: Secondary | ICD-10-CM

## 2018-08-18 DIAGNOSIS — E669 Obesity, unspecified: Secondary | ICD-10-CM | POA: Diagnosis not present

## 2018-08-18 DIAGNOSIS — Z01419 Encounter for gynecological examination (general) (routine) without abnormal findings: Secondary | ICD-10-CM | POA: Diagnosis not present

## 2018-08-18 NOTE — Progress Notes (Signed)
Subjective:   Regina Anderson is a 37 y.o. G2P0 Caucasian female here for a routine well-woman exam.  Patient's last menstrual period was 08/07/2018.    Current complaints: migraines have picked up in frequency. Desires referral for botox.  PCP: Lada       does desire labs  Social History: Sexual: heterosexual Marital Status: married Living situation: with family Occupation: Pharmacist, hospital Tobacco/alcohol: no tobacco use Illicit drugs: no history of illicit drug use  The following portions of the patient's history were reviewed and updated as appropriate: allergies, current medications, past family history, past medical history, past social history, past surgical history and problem list.  Past Medical History Past Medical History:  Diagnosis Date  . Asthma    exercised induced  . Depression   . Headache    menstral migraines  . Migraine headache with aura     Past Surgical History Past Surgical History:  Procedure Laterality Date  . CESAREAN SECTION N/A 2008  . CHOLECYSTECTOMY N/A 05/16/2015   Procedure: LAPAROSCOPIC CHOLECYSTECTOMY WITH INTRAOPERATIVE CHOLANGIOGRAM;  Surgeon: Leonie Green, MD;  Location: [facility] ORS;  Service: General;  Laterality: N/A;  . DILATION AND CURETTAGE OF UTERUS N/A 2007   miossed ab  . FRACTURE SURGERY Left 2004   left index finger 3 screws  . LEEP  2006    Gynecologic History G2P0  Patient's last menstrual period was 08/07/2018. Contraception: tubal ligation Last Pap: 2017. Results were: normal   Obstetric History OB History  Gravida Para Term Preterm AB Living  2            SAB TAB Ectopic Multiple Live Births               # Outcome Date GA Lbr Len/2nd Weight Sex Delivery Anes PTL Lv  2 Gravida 2008    M CS-Unspec     1 Gravida 2001    F Vag-Spont       Current Medications Current Outpatient Medications on File Prior to Visit  Medication Sig Dispense Refill  . naproxen (NAPROSYN) 500 MG tablet Take 1 tablet (500 mg total) by  mouth 2 (two) times daily with a meal. As needed 40 tablet 0  . rizatriptan (MAXALT) 10 MG tablet TAKE 1 TABLET BY MOUTH AS NEEDED FOR MIGRANE. MAY REPEAT IN 2 HOURS IF NEEDED 10 tablet 2  . cyanocobalamin (,VITAMIN B-12,) 1000 MCG/ML injection Inject 1 mL (1,000 mcg total) into the muscle every 30 (thirty) days. (Patient not taking: Reported on 06/06/2018) 10 mL 1   No current facility-administered medications on file prior to visit.     Review of Systems Patient denies any headaches, blurred vision, shortness of breath, chest pain, abdominal pain, problems with bowel movements, urination, or intercourse.  Objective:  BP (!) 92/55   Pulse 68   Ht 5\' 10"  (1.778 m)   Wt 210 lb (95.3 kg)   LMP 08/07/2018   BMI 30.13 kg/m  Physical Exam  General:  Well developed, well nourished, no acute distress. She is alert and oriented x3. Skin:  Warm and dry Neck:  Midline trachea, no thyromegaly or nodules Cardiovascular: Regular rate and rhythm, no murmur heard Lungs:  Effort normal, all lung fields clear to auscultation bilaterally Breasts:  No dominant palpable mass, retraction, or nipple discharge Abdomen:  Soft, non tender, no hepatosplenomegaly or masses Pelvic:  External genitalia is normal in appearance.  The vagina is normal in appearance. The cervix is bulbous, no CMT.  Thin prep pap is  done with HR HPV cotesting. Uterus is felt to be normal size, shape, and contour.  No adnexal masses or tenderness noted. Extremities:  No swelling or varicosities noted Psych:  She has a normal mood and affect  Assessment:   Healthy well-woman exam Migraines Obesity   Plan:  Labs obtained will follow up accordingly. Referred to Dr Manuella Ghazi at [facility]. Encouraged weight loss and increasing exercising.                         F/U 1 year for AE, or sooner if needed   Melody Rockney Ghee, CNM

## 2018-08-18 NOTE — Patient Instructions (Signed)
Preventive Care 18-39 Years, Female Preventive care refers to lifestyle choices and visits with your health care provider that can promote health and wellness. What does preventive care include?  A yearly physical exam. This is also called an annual well check.  Dental exams once or twice a year.  Routine eye exams. Ask your health care provider how often you should have your eyes checked.  Personal lifestyle choices, including: ? Daily care of your teeth and gums. ? Regular physical activity. ? Eating a healthy diet. ? Avoiding tobacco and drug use. ? Limiting alcohol use. ? Practicing safe sex. ? Taking vitamin and mineral supplements as recommended by your health care provider. What happens during an annual well check? The services and screenings done by your health care provider during your annual well check will depend on your age, overall health, lifestyle risk factors, and family history of disease. Counseling Your health care provider may ask you questions about your:  Alcohol use.  Tobacco use.  Drug use.  Emotional well-being.  Home and relationship well-being.  Sexual activity.  Eating habits.  Work and work Statistician.  Method of birth control.  Menstrual cycle.  Pregnancy history.  Screening You may have the following tests or measurements:  Height, weight, and BMI.  Diabetes screening. This is done by checking your blood sugar (glucose) after you have not eaten for a while (fasting).  Blood pressure.  Lipid and cholesterol levels. These may be checked every 5 years starting at age 38.  Skin check.  Hepatitis C blood test.  Hepatitis B blood test.  Sexually transmitted disease (STD) testing.  BRCA-related cancer screening. This may be done if you have a family history of breast, ovarian, tubal, or peritoneal cancers.  Pelvic exam and Pap test. This may be done every 3 years starting at age 38. Starting at age 30, this may be done  every 5 years if you have a Pap test in combination with an HPV test.  Discuss your test results, treatment options, and if necessary, the need for more tests with your health care provider. Vaccines Your health care provider may recommend certain vaccines, such as:  Influenza vaccine. This is recommended every year.  Tetanus, diphtheria, and acellular pertussis (Tdap, Td) vaccine. You may need a Td booster every 10 years.  Varicella vaccine. You may need this if you have not been vaccinated.  HPV vaccine. If you are 39 or younger, you may need three doses over 6 months.  Measles, mumps, and rubella (MMR) vaccine. You may need at least one dose of MMR. You may also need a second dose.  Pneumococcal 13-valent conjugate (PCV13) vaccine. You may need this if you have certain conditions and were not previously vaccinated.  Pneumococcal polysaccharide (PPSV23) vaccine. You may need one or two doses if you smoke cigarettes or if you have certain conditions.  Meningococcal vaccine. One dose is recommended if you are age 68-21 years and a first-year college student living in a residence hall, or if you have one of several medical conditions. You may also need additional booster doses.  Hepatitis A vaccine. You may need this if you have certain conditions or if you travel or work in places where you may be exposed to hepatitis A.  Hepatitis B vaccine. You may need this if you have certain conditions or if you travel or work in places where you may be exposed to hepatitis B.  Haemophilus influenzae type b (Hib) vaccine. You may need this  if you have certain risk factors.  Talk to your health care provider about which screenings and vaccines you need and how often you need them. This information is not intended to replace advice given to you by your health care provider. Make sure you discuss any questions you have with your health care provider. Document Released: 02/08/2002 Document Revised:  09/01/2016 Document Reviewed: 10/14/2015 Elsevier Interactive Patient Education  2018 Elsevier Inc.  

## 2018-08-19 LAB — COMPREHENSIVE METABOLIC PANEL
ALT: 20 IU/L (ref 0–32)
AST: 20 IU/L (ref 0–40)
Albumin/Globulin Ratio: 1.9 (ref 1.2–2.2)
Albumin: 4.7 g/dL (ref 3.5–5.5)
Alkaline Phosphatase: 91 IU/L (ref 39–117)
BUN/Creatinine Ratio: 16 (ref 9–23)
BUN: 9 mg/dL (ref 6–20)
Bilirubin Total: 0.3 mg/dL (ref 0.0–1.2)
CALCIUM: 9.4 mg/dL (ref 8.7–10.2)
CO2: 22 mmol/L (ref 20–29)
Chloride: 101 mmol/L (ref 96–106)
Creatinine, Ser: 0.58 mg/dL (ref 0.57–1.00)
GFR calc Af Amer: 135 mL/min/{1.73_m2} (ref >59)
GFR, EST NON AFRICAN AMERICAN: 117 mL/min/{1.73_m2} (ref >59)
GLUCOSE: 86 mg/dL (ref 65–99)
Globulin, Total: 2.5 g/dL (ref 1.5–4.5)
Potassium: 3.8 mmol/L (ref 3.5–5.2)
Sodium: 140 mmol/L (ref 134–144)
TOTAL PROTEIN: 7.2 g/dL (ref 6.0–8.5)

## 2018-08-19 LAB — THYROID PANEL WITH TSH
FREE THYROXINE INDEX: 1.6 (ref 1.2–4.9)
T3 Uptake Ratio: 24 % (ref 24–39)
T4, Total: 6.5 ug/dL (ref 4.5–12.0)
TSH: 1.18 u[IU]/mL (ref 0.450–4.500)

## 2018-08-19 LAB — LIPID PANEL
CHOL/HDL RATIO: 2.2 ratio (ref 0.0–4.4)
Cholesterol, Total: 182 mg/dL (ref 100–199)
HDL: 84 mg/dL (ref >39)
LDL Calculated: 87 mg/dL (ref 0–99)
TRIGLYCERIDES: 53 mg/dL (ref 0–149)
VLDL CHOLESTEROL CAL: 11 mg/dL (ref 5–40)

## 2018-08-24 LAB — CYTOLOGY - PAP

## 2018-09-05 ENCOUNTER — Other Ambulatory Visit: Payer: Self-pay | Admitting: *Deleted

## 2018-09-05 MED ORDER — PHENTERMINE HCL 37.5 MG PO TABS: 37.5000 mg | ORAL_TABLET | Freq: Every day | ORAL | 2 refills | Status: DC

## 2018-09-12 ENCOUNTER — Encounter: Payer: Self-pay | Admitting: Family Medicine

## 2018-09-14 ENCOUNTER — Encounter: Payer: Self-pay | Admitting: Obstetrics and Gynecology

## 2018-10-30 ENCOUNTER — Other Ambulatory Visit: Payer: Self-pay | Admitting: Obstetrics and Gynecology

## 2018-11-03 DIAGNOSIS — E538 Deficiency of other specified B group vitamins: Secondary | ICD-10-CM | POA: Diagnosis not present

## 2018-11-03 DIAGNOSIS — E559 Vitamin D deficiency, unspecified: Secondary | ICD-10-CM | POA: Diagnosis not present

## 2018-11-03 DIAGNOSIS — G43119 Migraine with aura, intractable, without status migrainosus: Secondary | ICD-10-CM | POA: Diagnosis not present

## 2018-11-10 DIAGNOSIS — E538 Deficiency of other specified B group vitamins: Secondary | ICD-10-CM | POA: Diagnosis not present

## 2018-11-10 DIAGNOSIS — G43119 Migraine with aura, intractable, without status migrainosus: Secondary | ICD-10-CM | POA: Diagnosis not present

## 2018-11-17 DIAGNOSIS — G43119 Migraine with aura, intractable, without status migrainosus: Secondary | ICD-10-CM | POA: Diagnosis not present

## 2018-11-27 DIAGNOSIS — G43119 Migraine with aura, intractable, without status migrainosus: Secondary | ICD-10-CM | POA: Diagnosis not present

## 2018-12-01 ENCOUNTER — Ambulatory Visit (INDEPENDENT_AMBULATORY_CARE_PROVIDER_SITE_OTHER): Payer: BLUE CROSS/BLUE SHIELD

## 2018-12-01 DIAGNOSIS — Z23 Encounter for immunization: Secondary | ICD-10-CM

## 2019-01-22 DIAGNOSIS — J22 Unspecified acute lower respiratory infection: Secondary | ICD-10-CM | POA: Diagnosis not present

## 2019-01-22 DIAGNOSIS — J019 Acute sinusitis, unspecified: Secondary | ICD-10-CM | POA: Diagnosis not present

## 2019-03-23 ENCOUNTER — Other Ambulatory Visit: Payer: Self-pay

## 2019-03-23 MED ORDER — ALBUTEROL SULFATE HFA 108 (90 BASE) MCG/ACT IN AERS: 2.0000 | INHALATION_SPRAY | Freq: Four times a day (QID) | RESPIRATORY_TRACT | 0 refills | Status: DC | PRN

## 2019-04-21 ENCOUNTER — Other Ambulatory Visit: Payer: Self-pay | Admitting: Obstetrics and Gynecology

## 2019-05-04 ENCOUNTER — Other Ambulatory Visit: Payer: Self-pay | Admitting: Obstetrics and Gynecology

## 2019-05-20 ENCOUNTER — Other Ambulatory Visit: Payer: Self-pay | Admitting: Obstetrics and Gynecology

## 2019-07-04 ENCOUNTER — Other Ambulatory Visit: Payer: Self-pay | Admitting: Obstetrics and Gynecology

## 2019-07-04 DIAGNOSIS — R5383 Other fatigue: Secondary | ICD-10-CM

## 2019-07-04 DIAGNOSIS — Z7689 Persons encountering health services in other specified circumstances: Secondary | ICD-10-CM

## 2019-07-06 ENCOUNTER — Other Ambulatory Visit: Payer: Self-pay | Admitting: Obstetrics and Gynecology

## 2019-07-06 DIAGNOSIS — R5383 Other fatigue: Secondary | ICD-10-CM

## 2019-08-24 ENCOUNTER — Encounter: Payer: BLUE CROSS/BLUE SHIELD | Admitting: Obstetrics and Gynecology

## 2019-11-21 ENCOUNTER — Encounter: Payer: BLUE CROSS/BLUE SHIELD | Admitting: Obstetrics and Gynecology

## 2020-06-03 NOTE — Progress Notes (Signed)
New patient visit   Patient: Regina Anderson   DOB: 1981-03-18   39 y.o. Female  MRN: [id number] Visit Date: 06/04/2020  Today's healthcare provider: Marcille Buffy, FNP   Chief Complaint  Patient presents with  . New Patient (Initial Visit)   Subjective    [name] is a 39 y.o. female who presents today as a new patient to establish care.  HPI  Patient comes to office today to establish patent care, she reports that her previous primary care doctor has now retired. Patient reports that she feels well today and has no concerns or complaints to address. 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 Patient reports that sleep patterns are fairly poor, on average she sleeps 4-6hrs a night, patient reports trouble staying asleep. Patient reports that her specialist recently increased her Amitriptyline to 50 mg to help with sleep and she reports that medication was helping with symptom control but states today that she believes that she has built up a tolerance to medication and it is no longer helping her stay asleep through the night. Patient reports that her last migraine was in April, patient sees Dr. Manuella Ghazi- neurology  for migraine headaches. Patient reports that her previous gynecology exams were through her PCP and states that she is still considering requesting referral to see gynecologist, patient denies any history of abnormal pap, last reported pap 08/18/18 patient to repeat in 3 years.. Patient reports that she did have a mammogram done 05/03/14 due to a lump being found in her breast but states that it was found she had a infection in her mammary gland. Patient reports that she does monthly breast self checks and has not noticed any changes to breast tissue.   Patient  denies any fever, body aches,chills, rash, chest pain, shortness of breath,  nausea, vomiting, or diarrhea. Denies dizziness, lightheadedness, pre syncopal or syncopal episodes.    Patient reports that she is seeing  [facility] in the next 10 days for a full body scan. She has an area she is concerned about right upper thigh..  Past Medical History:  Diagnosis Date  . Asthma    exercised induced  . Depression   . Headache    menstral migraines  . Migraine headache with aura    Past Surgical History:  Procedure Laterality Date  . CESAREAN SECTION N/A 2008  . CHOLECYSTECTOMY N/A 05/16/2015   Procedure: LAPAROSCOPIC CHOLECYSTECTOMY WITH INTRAOPERATIVE CHOLANGIOGRAM;  Surgeon: Leonie Green, MD;  Location: [facility] ORS;  Service: General;  Laterality: N/A;  . DILATION AND CURETTAGE OF UTERUS N/A 2007   miossed ab  . FRACTURE SURGERY Left 2004   left index finger 3 screws  . LEEP  2006   Family Status  Relation Name Status  . Mother  Alive  . Father  Alive  . Annamarie Major  (Not Specified)  . MGM  (Not Specified)  . MGF  (Not Specified)  . PGM  (Not Specified)  . PGF  (Not Specified)   Family History  Problem Relation Age of Onset  . Hyperlipidemia Mother   . Cancer Father        lung  . Heart disease Father   . Heart attack Father   . Hyperlipidemia Father   . Diabetes Paternal Uncle   . Cancer Maternal Grandmother  lung  . Hypertension Maternal Grandmother   . Hypertension Maternal Grandfather   . Diabetes Paternal Grandmother   . Heart disease Paternal Grandmother   . Hypertension Paternal Grandmother   . Diabetes Paternal Grandfather   . Hypertension Paternal Grandfather    Social History   Socioeconomic History  . Marital status: Married    Spouse name: Not on file  . Number of children: Not on file  . Years of education: Not on file  . Highest education level: Not on file  Occupational History  . Not on file  Tobacco Use  . Smoking status: Never Smoker  . Smokeless tobacco: Never Used  Substance and Sexual  Activity  . Alcohol use: Yes    Alcohol/week: 6.0 standard drinks    Types: 6 Cans of beer per week    Comment: occasional  . Drug use: No  . Sexual activity: Yes    Comment: bilateral tubal ligation  Other Topics Concern  . Not on file  Social History Narrative  . Not on file   Social Determinants of Health   Financial Resource Strain:   . Difficulty of Paying Living Expenses:   Food Insecurity:   . Worried About Charity fundraiser in the Last Year:   . Arboriculturist in the Last Year:   Transportation Needs:   . Film/video editor (Medical):   Marland Kitchen Lack of Transportation (Non-Medical):   Physical Activity:   . Days of Exercise per Week:   . Minutes of Exercise per Session:   Stress:   . Feeling of Stress :   Social Connections:   . Frequency of Communication with Friends and Family:   . Frequency of Social Gatherings with Friends and Family:   . Attends Religious Services:   . Active Member of Clubs or Organizations:   . Attends Archivist Meetings:   Marland Kitchen Marital Status:    Outpatient Medications Prior to Visit  Medication Sig  . amitriptyline (ELAVIL) 50 MG tablet Take 50 mg by mouth at bedtime.  Marland Kitchen EMGALITY 120 MG/ML SOAJ SMARTSIG:1 Milliliter(s) SUB-Q Every 4 Weeks  . ketorolac (TORADOL) 10 MG tablet   . rizatriptan (MAXALT) 10 MG tablet TAKE 1 TABLET BY MOUTH AS NEEDED FOR MIGRANE. MAY REPEAT IN 2 HOURS IF NEEDED  . UBRELVY 100 MG TABS   . [DISCONTINUED] cyanocobalamin (,VITAMIN B-12,) 1000 MCG/ML injection Inject 1 mL (1,000 mcg total) into the muscle every 30 (thirty) days. (Patient not taking: Reported on 06/06/2018)  . [DISCONTINUED] naproxen (NAPROSYN) 500 MG tablet Take 1 tablet (500 mg total) by mouth 2 (two) times daily with a meal. As needed  . [DISCONTINUED] phentermine (ADIPEX-P) 37.5 MG tablet Take 1 tablet (37.5 mg total) by mouth daily before breakfast.  . [DISCONTINUED] PROAIR HFA 108 (90 Base) MCG/ACT inhaler INHALE 2 PUFFS INTO THE LUNGS  EVERY 6 HOURS AS NEEDED FOR WHEEZING OR SHORTNESS OF BREATH   No facility-administered medications prior to visit.   Allergies  Allergen Reactions  . Macadamia Nut Oil Anaphylaxis    Immunization History  Administered Date(s) Administered  . Hepatitis B 09/11/2012, 10/12/2012, 02/15/2013  . Influenza,inj,Quad PF,6+ Mos 11/17/2016, 10/18/2017, 12/01/2018  . Influenza-Unspecified 10/29/2015  . MMR 09/11/2012, 10/12/2012  . Tdap 07/23/2009    Health Maintenance  Topic Date Due  . Hepatitis C Screening  Never done  . TETANUS/TDAP  07/24/2019  . INFLUENZA VACCINE  07/27/2020  . PAP SMEAR-Modifier  08/18/2021  . HIV Screening  Completed  Patient Care Team: Lada, Satira Anis, MD as PCP - General (Family Medicine)  Review of Systems  Constitutional: Positive for fatigue. Negative for activity change, appetite change, chills, diaphoresis, fever and unexpected weight change.  Respiratory: Negative.   Cardiovascular: Negative.   Genitourinary: Negative.   Musculoskeletal: Negative.   Skin: Negative.   Hematological: Negative.   Psychiatric/Behavioral: Positive for sleep disturbance. Negative for agitation, behavioral problems, confusion, decreased concentration, dysphoric mood, hallucinations, self-injury and suicidal ideas. The patient is not nervous/anxious and is not hyperactive.   All other systems reviewed and are negative.   Last CBC Lab Results  Component Value Date   WBC 6.8 04/23/2015   HGB 14.1 04/23/2015   HCT 42.1 04/23/2015   MCV 90 04/23/2015   MCH 30.1 04/23/2015   RDW 13.4 04/23/2015   PLT 158 76/28/3151   Last metabolic panel Lab Results  Component Value Date   GLUCOSE 86 08/18/2018   NA 140 08/18/2018   K 3.8 08/18/2018   CL 101 08/18/2018   CO2 22 08/18/2018   BUN 9 08/18/2018   CREATININE 0.58 08/18/2018   GFRNONAA 117 08/18/2018   GFRAA 135 08/18/2018   CALCIUM 9.4 08/18/2018   PROT 7.2 08/18/2018   ALBUMIN 4.7 08/18/2018   LABGLOB 2.5  08/18/2018   AGRATIO 1.9 08/18/2018   BILITOT 0.3 08/18/2018   ALKPHOS 91 08/18/2018   AST 20 08/18/2018   ALT 20 08/18/2018   ANIONGAP 8 04/23/2015   Last lipids Lab Results  Component Value Date   CHOL 182 08/18/2018   HDL 84 08/18/2018   LDLCALC 87 08/18/2018   TRIG 53 08/18/2018   CHOLHDL 2.2 08/18/2018   Last hemoglobin A1c No results found for: HGBA1C Last thyroid functions Lab Results  Component Value Date   TSH 1.180 08/18/2018   T4TOTAL 6.5 08/18/2018   Last vitamin D No results found for: 25OHVITD2, 25OHVITD3, VD25OH Last vitamin B12 and Folate No results found for: VITAMINB12, FOLATE    Objective    BP 110/82   Pulse 81   Temp (!) 97.1 F (36.2 C) (Oral)   Resp 16   Ht _0  (1.778 m)   Wt 203 lb 12.8 oz (92.4 kg)   LMP 05/07/2020 (Exact Date)   SpO2 99%   BMI 29.24 kg/m  Physical Exam Vitals reviewed.  Constitutional:      General: She is not in acute distress.    Appearance: Normal appearance. She is normal weight. She is not ill-appearing, toxic-appearing or diaphoretic.     Comments: Patient appers well, not sickly. Speaking in complete sentences. Patient moves on and off of exam table and in room without difficulty. Gait is normal in hall and in room. Patient is oriented to person place time and situation. Patient answers questions appropriately and engages eye contact and verbal dialect with provider.    HENT:     Head: Normocephalic and atraumatic.     Right Ear: Tympanic membrane and external ear normal. There is impacted cerumen.     Left Ear: External ear normal. There is impacted cerumen.     Nose: Nose normal. No congestion or rhinorrhea.     Mouth/Throat:     Mouth: Mucous membranes are moist.     Pharynx: Oropharynx is clear. No oropharyngeal exudate or posterior oropharyngeal erythema.  Eyes:     General: No scleral icterus.       Right eye: No discharge.        Left eye: No discharge.  Extraocular Movements: Extraocular  movements intact.     Conjunctiva/sclera: Conjunctivae normal.  Cardiovascular:     Rate and Rhythm: Normal rate and regular rhythm.     Pulses: Normal pulses.     Heart sounds: Normal heart sounds.  Pulmonary:     Effort: Pulmonary effort is normal. No respiratory distress.     Breath sounds: Normal breath sounds. No stridor. No wheezing, rhonchi or rales.  Chest:     Breasts: Tanner Score is 5.        Left: Normal.  Abdominal:     General: Abdomen is flat. There is no distension.     Palpations: Abdomen is soft. There is no mass.     Tenderness: There is no abdominal tenderness. There is no right CVA tenderness, left CVA tenderness, guarding or rebound.     Hernia: No hernia is present.  Genitourinary:    Vagina: Normal.     Cervix: Normal.     Uterus: Normal.      Adnexa: Right adnexa normal.     Rectum: No external hemorrhoid.     Comments: deferred  Musculoskeletal:        General: Normal range of motion.     Cervical back: Normal range of motion and neck supple.  Skin:    General: Skin is dry.     Capillary Refill: Capillary refill takes less than 2 seconds.     Findings: Lesion and rash present. No ecchymosis or signs of injury. Rash is papular.          Comments: Small 10m x 2 mm slightly rasied hypopigmented lesion left upper anterior thigh.  Neurological:     Mental Status: She is alert and oriented to person, place, and time.     Cranial Nerves: No cranial nerve deficit.     Sensory: No sensory deficit.     Motor: No weakness.     Coordination: Coordination normal.     Gait: Gait normal.     Deep Tendon Reflexes: Reflexes normal.  Psychiatric:        Attention and Perception: Attention normal.        Mood and Affect: Mood normal.        Speech: Speech normal.        Behavior: Behavior normal. Behavior is cooperative.        Thought Content: Thought content normal.        Cognition and Memory: Cognition normal.        Judgment: Judgment normal.       Depression Screen PHQ 2/9 Scores 06/04/2020 06/04/2020  PHQ - 2 Score 0 0  PHQ- 9 Score 6 -   No results found for any visits on 06/04/20.  Assessment & Plan     Routine adult health maintenance  Vitamin D deficiency - Plan: Vitamin D (25 hydroxy)  Encounter for screening mammogram for malignant neoplasm of breast - Plan: MM DIGITAL SCREENING BILATERAL  Screening for lipid disorders - Plan: Lipid panel  Fatigue, unspecified type - Plan: CBC with Differential/Platelet, Comprehensive metabolic panel, TSH  Intractable migraine with aura without status migrainosus  Family history of diverticulitis of colon  Adjustment disorder with depressed mood  Depression, unspecified depression type  Migraines much improved with UBRELVY. She only now occasionally has to have rescue therapy.   1. Vitamin D deficiency Not currently taking has been on supplement and has had low end vitamin D levels in the past.  - Vitamin D (25 hydroxy)  2.  Encounter for screening mammogram for malignant neoplasm of breast [place] information to call and schedule.  - MM DIGITAL SCREENING BILATERAL; Future  3. Screening for lipid disorders  - Lipid panel  4. Fatigue, unspecified type Not sleeping well, sleep hygiene, also recently had increase at neurology [facility] from 25 mg to 50 mg of Amitriptyline . It helped at first but not helping as much now. She will discuss with Neurology since dose was recently adjusted recommend trying another month to see if adjusts. She has also tried  Melatonin without any relief in the past.  - CBC with Differential/Platelet - Comprehensive metabolic panel - TSH  Skin lesion right upper thigh- keep full body check on next Monday with [facility].  Call to schedule mammogram.   Return for PAP last was 2020 she had been on every 6 month pap's until two years ago. She is due now for yearly PAP's. She saw Melody Shambley who is no longer working. She  can come here for PAP and if abnormal will need to be referred  To gynecology. Records request.    Orders Placed This Encounter  Procedures  . MM DIGITAL SCREENING BILATERAL    Standing Status:   Future    Standing Expiration Date:   12/04/2020    Order Specific Question:   Reason for Exam (SYMPTOM  OR DIAGNOSIS REQUIRED)    Answer:   screening breast cancer    Order Specific Question:   Is the patient pregnant?    Answer:   No    Order Specific Question:   Preferred imaging location?    Answer:   [facility]  . Lipid panel  . CBC with Differential/Platelet  . Comprehensive metabolic panel  . TSH  . Vitamin D (25 hydroxy)    The patient is advised to begin progressive daily aerobic exercise program, follow a low fat, low cholesterol diet, attempt to lose weight, decrease or avoid alcohol intake, reduce salt in diet and cooking, reduce exposure to stress, improve dietary compliance, continue current medications, continue current healthy lifestyle patterns and return for routine annual checkups.   Patient verbalized understanding of all instructions given and denies any further questions at this time.   Advised patient call the office or your primary care doctor for an appointment if no improvement within 72 hours or if any symptoms change or worsen at any time  Advised ER or urgent Care if after hours or on weekend. Call 911 for emergency symptoms at any time.Patinet verbalized understanding of all instructions given/reviewed and treatment plan and has no further questions or concerns at this time.     Return if symptoms worsen or fail to improve.     IWellington Hampshire Daurice Ovando, FNP, have reviewed all documentation for this visit. The documentation on 06/04/20 for the exam, diagnosis, procedures, and orders are all accurate and complete.   Marcille Buffy, [place] [phone] (phone) [phone] (fax)  [place]

## 2020-06-04 ENCOUNTER — Encounter: Payer: Self-pay | Admitting: Adult Health

## 2020-06-04 ENCOUNTER — Other Ambulatory Visit: Payer: Self-pay

## 2020-06-04 ENCOUNTER — Ambulatory Visit: Payer: BC Managed Care – PPO | Admitting: Adult Health

## 2020-06-04 VITALS — BP 110/82 | HR 81 | Temp 97.1°F | Resp 16 | Ht 70.0 in | Wt 203.8 lb

## 2020-06-04 DIAGNOSIS — F32A Depression, unspecified: Secondary | ICD-10-CM

## 2020-06-04 DIAGNOSIS — R5383 Other fatigue: Secondary | ICD-10-CM

## 2020-06-04 DIAGNOSIS — Z1231 Encounter for screening mammogram for malignant neoplasm of breast: Secondary | ICD-10-CM | POA: Diagnosis not present

## 2020-06-04 DIAGNOSIS — F4321 Adjustment disorder with depressed mood: Secondary | ICD-10-CM

## 2020-06-04 DIAGNOSIS — Z0000 Encounter for general adult medical examination without abnormal findings: Secondary | ICD-10-CM | POA: Diagnosis not present

## 2020-06-04 DIAGNOSIS — G43119 Migraine with aura, intractable, without status migrainosus: Secondary | ICD-10-CM

## 2020-06-04 DIAGNOSIS — Z13220 Encounter for screening for lipoid disorders: Secondary | ICD-10-CM | POA: Diagnosis not present

## 2020-06-04 DIAGNOSIS — E559 Vitamin D deficiency, unspecified: Secondary | ICD-10-CM | POA: Diagnosis not present

## 2020-06-04 DIAGNOSIS — Z6829 Body mass index (BMI) 29.0-29.9, adult: Secondary | ICD-10-CM

## 2020-06-04 DIAGNOSIS — Z8379 Family history of other diseases of the digestive system: Secondary | ICD-10-CM

## 2020-06-04 DIAGNOSIS — D492 Neoplasm of unspecified behavior of bone, soft tissue, and skin: Secondary | ICD-10-CM | POA: Insufficient documentation

## 2020-06-04 DIAGNOSIS — F329 Major depressive disorder, single episode, unspecified: Secondary | ICD-10-CM

## 2020-06-04 NOTE — Patient Instructions (Addendum)
Try over the counter Debrox to help soften wax from ears.   Call to schedule your screening mammogram. Your orders have been placed for your exam.  Let our office know if you have questions, concerns, or any difficulty scheduling.  If normal results then yearly screening mammograms are recommended unless you notice  Changes in your breast then you should schedule a follow up office visit. If abnormal results  Further imaging will be warranted and sooner follow up as determined by the radiologist at the [facility].   [facility] [address], [place] [address]  Main: [phone]    Health Maintenance, Female Adopting a healthy lifestyle and getting preventive care are important in promoting health and wellness. Ask your health care provider about:  The right schedule for you to have regular tests and exams.  Things you can do on your own to prevent diseases and keep yourself healthy. What should I know about diet, weight, and exercise? Eat a healthy diet   Eat a diet that includes plenty of vegetables, fruits, low-fat dairy products, and lean protein.  Do not eat a lot of foods that are high in solid fats, added sugars, or sodium. Maintain a healthy weight Body mass index (BMI) is used to identify weight problems. It estimates body fat based on height and weight. Your health care provider can help determine your BMI and help you achieve or maintain a healthy weight. Get regular exercise Get regular exercise. This is one of the most important things you can do for your health. Most adults should:  Exercise for at least 150 minutes each week. The exercise should increase your heart rate and make you sweat (moderate-intensity exercise).  Do strengthening exercises at least twice a week. This is in addition to the moderate-intensity exercise.  Spend less time sitting. Even light physical activity can be beneficial. Watch cholesterol  and blood lipids Have your blood tested for lipids and cholesterol at 41 years of age, then have this test every 5 years. Have your cholesterol levels checked more often if:  Your lipid or cholesterol levels are high.  You are older than 41 years of age.  You are at high risk for heart disease. What should I know about cancer screening? Depending on your health history and family history, you may need to have cancer screening at various ages. This may include screening for:  Breast cancer.  Cervical cancer.  Colorectal cancer.  Skin cancer.  Lung cancer. What should I know about heart disease, diabetes, and high blood pressure? Blood pressure and heart disease  High blood pressure causes heart disease and increases the risk of stroke. This is more likely to develop in people who have high blood pressure readings, are of African descent, or are overweight.  Have your blood pressure checked: ? Every 3-5 years if you are 4-71 years of age. ? Every year if you are 48 years old or older. Diabetes Have regular diabetes screenings. This checks your fasting blood sugar level. Have the screening done:  Once every three years after age 59 if you are at a normal weight and have a low risk for diabetes.  More often and at a younger age if you are overweight or have a high risk for diabetes. What should I know about preventing infection? Hepatitis B If you have a higher risk for hepatitis B, you should be screened for this virus. Talk with your health care provider to find  out if you are at risk for hepatitis B infection. Hepatitis C Testing is recommended for:  Everyone born from [birth year] through 1967.  Anyone with known risk factors for hepatitis C. Sexually transmitted infections (STIs)  Get screened for STIs, including gonorrhea and chlamydia, if: ? You are sexually active and are younger than 41 years of age. ? You are older than 41 years of age and your health care provider  tells you that you are at risk for this type of infection. ? Your sexual activity has changed since you were last screened, and you are at increased risk for chlamydia or gonorrhea. Ask your health care provider if you are at risk.  Ask your health care provider about whether you are at high risk for HIV. Your health care provider may recommend a prescription medicine to help prevent HIV infection. If you choose to take medicine to prevent HIV, you should first get tested for HIV. You should then be tested every 3 months for as long as you are taking the medicine. Pregnancy  If you are about to stop having your period (premenopausal) and you may become pregnant, seek counseling before you get pregnant.  Take 400 to 800 micrograms (mcg) of folic acid every day if you become pregnant.  Ask for birth control (contraception) if you want to prevent pregnancy. Osteoporosis and menopause Osteoporosis is a disease in which the bones lose minerals and strength with aging. This can result in bone fractures. If you are 85 years old or older, or if you are at risk for osteoporosis and fractures, ask your health care provider if you should:  Be screened for bone loss.  Take a calcium or vitamin D supplement to lower your risk of fractures.  Be given hormone replacement therapy (HRT) to treat symptoms of menopause. Follow these instructions at home: Lifestyle  Do not use any products that contain nicotine or tobacco, such as cigarettes, e-cigarettes, and chewing tobacco. If you need help quitting, ask your health care provider.  Do not use street drugs.  Do not share needles.  Ask your health care provider for help if you need support or information about quitting drugs. Alcohol use  Do not drink alcohol if: ? Your health care provider tells you not to drink. ? You are pregnant, may be pregnant, or are planning to become pregnant.  If you drink alcohol: ? Limit how much you use to 0-1 drink a  day. ? Limit intake if you are breastfeeding.  Be aware of how much alcohol is in your drink. In the [place] one drink equals one 12 oz bottle of beer (355 mL), one 5 oz glass of wine (148 mL), or one 1 oz glass of hard liquor (44 mL). General instructions  Schedule regular health, dental, and eye exams.  Stay current with your vaccines.  Tell your health care provider if: ? You often feel depressed. ? You have ever been abused or do not feel safe at home. Summary  Adopting a healthy lifestyle and getting preventive care are important in promoting health and wellness.  Follow your health care provider's instructions about healthy diet, exercising, and getting tested or screened for diseases.  Follow your health care provider's instructions on monitoring your cholesterol and blood pressure. This information is not intended to replace advice given to you by your health care provider. Make sure you discuss any questions you have with your health care provider. Document Revised: 12/06/2018 Document Reviewed: 12/06/2018 Elsevier Patient Education  [place] and Cholesterol Restricted Eating Plan Getting too much fat and cholesterol in your diet may cause health problems. Choosing the right foods helps keep your fat and cholesterol at normal levels. This can keep you from getting certain diseases. Your doctor may recommend an eating plan that includes:  Total fat: ______% or less of total calories a day.  Saturated fat: ______% or less of total calories a day.  Cholesterol: less than _________mg a day.  Fiber: ______g a day. What are tips for following this plan? Meal planning  At meals, divide your plate into four equal parts: ? Fill one-half of your plate with vegetables and green salads. ? Fill one-fourth of your plate with whole grains. ? Fill one-fourth of your plate with low-fat (lean) protein foods.  Eat fish that is high in omega-3 fats at least two times  a week. This includes mackerel, tuna, sardines, and salmon.  Eat foods that are high in fiber, such as whole grains, beans, apples, broccoli, carrots, peas, and barley. General tips   Work with your doctor to lose weight if you need to.  Avoid: ? Foods with added sugar. ? Fried foods. ? Foods with partially hydrogenated oils.  Limit alcohol intake to no more than 1 drink a day for nonpregnant women and 2 drinks a day for men. One drink equals 12 oz of beer, 5 oz of wine, or 1 oz of hard liquor. Reading food labels  Check food labels for: ? Trans fats. ? Partially hydrogenated oils. ? Saturated fat (g) in each serving. ? Cholesterol (mg) in each serving. ? Fiber (g) in each serving.  Choose foods with healthy fats, such as: ? Monounsaturated fats. ? Polyunsaturated fats. ? Omega-3 fats.  Choose grain products that have whole grains. Look for the word "whole" as the first word in the ingredient list. Cooking  Cook foods using low-fat methods. These include baking, boiling, grilling, and broiling.  Eat more home-cooked foods. Eat at restaurants and buffets less often.  Avoid cooking using saturated fats, such as butter, cream, palm oil, palm kernel oil, and coconut oil. Recommended foods  Fruits  All fresh, canned (in natural juice), or frozen fruits. Vegetables  Fresh or frozen vegetables (raw, steamed, roasted, or grilled). Green salads. Grains  Whole grains, such as whole wheat or whole grain breads, crackers, cereals, and pasta. Unsweetened oatmeal, bulgur, barley, quinoa, or brown rice. Corn or whole wheat flour tortillas. Meats and other protein foods  Ground beef (85% or leaner), grass-fed beef, or beef trimmed of fat. Skinless chicken or [place]. Ground chicken or [place]. Pork trimmed of fat. All fish and seafood. Egg whites. Dried beans, peas, or lentils. Unsalted nuts or seeds. Unsalted canned beans. Nut butters without added sugar or oil. Dairy  Low-fat or  nonfat dairy products, such as skim or 1% milk, 2% or reduced-fat cheeses, low-fat and fat-free ricotta or cottage cheese, or plain low-fat and nonfat yogurt. Fats and oils  Tub margarine without trans fats. Light or reduced-fat mayonnaise and salad dressings. Avocado. Olive, canola, sesame, or safflower oils. The items listed above may not be a complete list of foods and beverages you can eat. Contact a dietitian for more information. Foods to avoid Fruits  Canned fruit in heavy syrup. Fruit in cream or butter sauce. Fried fruit. Vegetables  Vegetables cooked in cheese, cream, or butter sauce. Fried vegetables. Grains  White bread. White pasta. White rice. Cornbread. Bagels, pastries, and croissants. Crackers and  snack foods that contain trans fat and hydrogenated oils. Meats and other protein foods  Fatty cuts of meat. Ribs, chicken wings, bacon, sausage, bologna, salami, chitterlings, fatback, hot dogs, bratwurst, and packaged lunch meats. Liver and organ meats. Whole eggs and egg yolks. Chicken and [place] with skin. Fried meat. Dairy  Whole or 2% milk, cream, half-and-half, and cream cheese. Whole milk cheeses. Whole-fat or sweetened yogurt. Full-fat cheeses. Nondairy creamers and whipped toppings. Processed cheese, cheese spreads, and cheese curds. Beverages  Alcohol. Sugar-sweetened drinks such as sodas, lemonade, and fruit drinks. Fats and oils  Butter, stick margarine, lard, shortening, ghee, or bacon fat. Coconut, palm kernel, and palm oils. Sweets and desserts  Corn syrup, sugars, honey, and molasses. Candy. Jam and jelly. Syrup. Sweetened cereals. Cookies, pies, cakes, donuts, muffins, and ice cream. The items listed above may not be a complete list of foods and beverages you should avoid. Contact a dietitian for more information. Summary  Choosing the right foods helps keep your fat and cholesterol at normal levels. This can keep you from getting certain diseases.  At  meals, fill one-half of your plate with vegetables and green salads.  Eat high-fiber foods, like whole grains, beans, apples, carrots, peas, and barley.  Limit added sugar, saturated fats, alcohol, and fried foods. This information is not intended to replace advice given to you by your health care provider. Make sure you discuss any questions you have with your health care provider. Document Revised: 08/16/2018 Document Reviewed: 08/30/2017 Elsevier Patient Education  [place].

## 2020-06-05 ENCOUNTER — Other Ambulatory Visit: Payer: Self-pay | Admitting: Adult Health

## 2020-06-05 DIAGNOSIS — E559 Vitamin D deficiency, unspecified: Secondary | ICD-10-CM

## 2020-06-05 LAB — CBC WITH DIFFERENTIAL/PLATELET
Basophils Absolute: 0 10*3/uL (ref 0.0–0.2)
Basos: 0 %
EOS (ABSOLUTE): 0.1 10*3/uL (ref 0.0–0.4)
Eos: 1 %
Hematocrit: 42.6 % (ref 34.0–46.6)
Hemoglobin: 13.9 g/dL (ref 11.1–15.9)
Immature Grans (Abs): 0 10*3/uL (ref 0.0–0.1)
Immature Granulocytes: 0 %
Lymphocytes Absolute: 1.8 10*3/uL (ref 0.7–3.1)
Lymphs: 30 %
MCH: 30 pg (ref 26.6–33.0)
MCHC: 32.6 g/dL (ref 31.5–35.7)
MCV: 92 fL (ref 79–97)
Monocytes Absolute: 0.4 10*3/uL (ref 0.1–0.9)
Monocytes: 7 %
Neutrophils Absolute: 3.7 10*3/uL (ref 1.4–7.0)
Neutrophils: 62 %
Platelets: 203 10*3/uL (ref 150–450)
RBC: 4.63 x10E6/uL (ref 3.77–5.28)
RDW: 13.2 % (ref 11.7–15.4)
WBC: 5.9 10*3/uL (ref 3.4–10.8)

## 2020-06-05 LAB — COMPREHENSIVE METABOLIC PANEL
ALT: 20 IU/L (ref 0–32)
AST: 23 IU/L (ref 0–40)
Albumin/Globulin Ratio: 1.6 (ref 1.2–2.2)
Albumin: 4.5 g/dL (ref 3.8–4.8)
Alkaline Phosphatase: 94 IU/L (ref 48–121)
BUN/Creatinine Ratio: 16 (ref 9–23)
BUN: 12 mg/dL (ref 6–24)
Bilirubin Total: 0.5 mg/dL (ref 0.0–1.2)
CO2: 22 mmol/L (ref 20–29)
Calcium: 9.2 mg/dL (ref 8.7–10.2)
Chloride: 102 mmol/L (ref 96–106)
Creatinine, Ser: 0.74 mg/dL (ref 0.57–1.00)
GFR calc Af Amer: 117 mL/min/{1.73_m2} (ref >59)
GFR calc non Af Amer: 102 mL/min/{1.73_m2} (ref >59)
Globulin, Total: 2.8 g/dL (ref 1.5–4.5)
Glucose: 79 mg/dL (ref 65–99)
Potassium: 4.1 mmol/L (ref 3.5–5.2)
Sodium: 138 mmol/L (ref 134–144)
Total Protein: 7.3 g/dL (ref 6.0–8.5)

## 2020-06-05 LAB — LIPID PANEL
Chol/HDL Ratio: 2.2 ratio (ref 0.0–4.4)
Cholesterol, Total: 192 mg/dL (ref 100–199)
HDL: 89 mg/dL (ref >39)
LDL Chol Calc (NIH): 93 mg/dL (ref 0–99)
Triglycerides: 53 mg/dL (ref 0–149)
VLDL Cholesterol Cal: 10 mg/dL (ref 5–40)

## 2020-06-05 LAB — VITAMIN D 25 HYDROXY (VIT D DEFICIENCY, FRACTURES): Vit D, 25-Hydroxy: 30.9 ng/mL (ref 30.0–100.0)

## 2020-06-05 LAB — TSH: TSH: 1.98 u[IU]/mL (ref 0.450–4.500)

## 2020-06-05 MED ORDER — VITAMIN D 125 MCG (5000 UT) PO CAPS: 1.0000 | ORAL_CAPSULE | Freq: Every day | ORAL | 0 refills | Status: DC

## 2020-06-05 NOTE — Progress Notes (Signed)
CBC within normal limits no signs of anemia or infection.  CMP within normal limits glucose, electrolytes, kidney and liver function. Cholesterol is within normal limits, continue healthy diet and at least 30 minutes of exercise daily.  TSH for thyroid  is within normal limits.  Vitamin D is on low end normal, I will refill her Vitamin D 5,000 international units daily and recheck Vitamin D lab in 4 to 6 months recommended.

## 2020-06-05 NOTE — Progress Notes (Signed)
Meds ordered this encounter  Medications  . Cholecalciferol (VITAMIN D) 125 MCG (5000 UT) CAPS    Sig: Take 1 capsule by mouth daily. Recheck lab in 6 months.    Dispense:  180 capsule    Refill:  0   Orders Placed This Encounter  Procedures  . VITAMIN D 25 Hydroxy (Vit-D Deficiency, Fractures)    Standing Status:   Future    Standing Expiration Date:   06/05/2021   Vitamin D deficiency - Plan: VITAMIN D 25 Hydroxy (Vit-D Deficiency, Fractures)  68-month recheck of vitamin D level is recommended.

## 2020-07-29 ENCOUNTER — Encounter: Payer: Self-pay | Admitting: Adult Health

## 2020-08-05 ENCOUNTER — Other Ambulatory Visit: Payer: Self-pay | Admitting: Adult Health

## 2020-08-05 DIAGNOSIS — E349 Endocrine disorder, unspecified: Secondary | ICD-10-CM

## 2020-08-21 ENCOUNTER — Ambulatory Visit (INDEPENDENT_AMBULATORY_CARE_PROVIDER_SITE_OTHER): Payer: BC Managed Care – PPO | Admitting: Certified Nurse Midwife

## 2020-08-21 ENCOUNTER — Encounter: Payer: Self-pay | Admitting: Certified Nurse Midwife

## 2020-08-21 ENCOUNTER — Other Ambulatory Visit: Payer: Self-pay

## 2020-08-21 VITALS — BP 117/81 | HR 104 | Ht 70.0 in | Wt 207.4 lb

## 2020-08-21 DIAGNOSIS — R61 Generalized hyperhidrosis: Secondary | ICD-10-CM | POA: Diagnosis not present

## 2020-08-21 DIAGNOSIS — L659 Nonscarring hair loss, unspecified: Secondary | ICD-10-CM

## 2020-08-21 DIAGNOSIS — R4586 Emotional lability: Secondary | ICD-10-CM

## 2020-08-21 DIAGNOSIS — N951 Menopausal and female climacteric states: Secondary | ICD-10-CM | POA: Diagnosis not present

## 2020-08-21 DIAGNOSIS — R232 Flushing: Secondary | ICD-10-CM

## 2020-08-21 DIAGNOSIS — R635 Abnormal weight gain: Secondary | ICD-10-CM

## 2020-08-21 DIAGNOSIS — R6882 Decreased libido: Secondary | ICD-10-CM

## 2020-08-21 NOTE — Patient Instructions (Signed)

## 2020-08-21 NOTE — Progress Notes (Signed)
GYN ENCOUNTER NOTE  Subjective:       Regina Anderson is a 39 y.o. [phone] female is here for gynecologic evaluation of the following issues:  1. Perimenopausal symptoms  Notes mood swings, hot flashes, weight gain, hair loss, low libido, and night sweats for the last few months.   Denies difficulty breathing or respiratory distress, chest pain, abdominal pain, excessive vaginal bleeding, dysuria, and leg pain or swelling.    Gynecologic History  Patient's last menstrual period was 07/30/2020 (exact date). Period Cycle (Days): 28 Period Duration (Days): Three (3) Period Pattern: Regular Menstrual Flow: Light Dysmenorrhea: None  Contraception: tubal ligation  Last Pap: 2019. Results were: Neg/Neg  Last mammogram: Ordered.   Obstetric History  OB History  Gravida Para Term Preterm AB Living  3 2 2   1 2   SAB TAB Ectopic Multiple Live Births  1       2    # Outcome Date GA Lbr Len/2nd Weight Sex Delivery Anes PTL Lv  3 Term 10/27/07   7 lb 10 oz (3.459 kg) M CS-Unspec  Y LIV  2 SAB 2007     SAB     1 Term 07/14/00   8 lb 13 oz (3.997 kg) F Vag-Spont  N LIV    Past Medical History:  Diagnosis Date  . Asthma    exercised induced  . Depression   . Headache    menstral migraines  . Migraine headache with aura     Past Surgical History:  Procedure Laterality Date  . CESAREAN SECTION N/A 2008  . CHOLECYSTECTOMY N/A 05/16/2015   Procedure: LAPAROSCOPIC CHOLECYSTECTOMY WITH INTRAOPERATIVE CHOLANGIOGRAM;  Surgeon: Leonie Green, MD;  Location: [facility] ORS;  Service: General;  Laterality: N/A;  . DILATION AND CURETTAGE OF UTERUS N/A 2007   miossed ab  . FRACTURE SURGERY Left 2004   left index finger 3 screws  . LEEP  2006  . SHOULDER ARTHROCENTESIS      Current Outpatient Medications on File Prior to Visit  Medication Sig Dispense Refill  . amitriptyline (ELAVIL) 50 MG tablet Take 50 mg by mouth at bedtime.    . Cholecalciferol (VITAMIN D) 125 MCG (5000 UT) CAPS  Take 1 capsule by mouth daily. Recheck lab in 6 months. 180 capsule 0  . EMGALITY 120 MG/ML SOAJ SMARTSIG:1 Milliliter(s) SUB-Q Every 4 Weeks    . ketorolac (TORADOL) 10 MG tablet     . UBRELVY 100 MG TABS      No current facility-administered medications on file prior to visit.    Allergies  Allergen Reactions  . Macadamia Nut Oil Anaphylaxis  . Other     Other reaction(s): Unknown Uncoded Allergy. Allergen: [place] History   Socioeconomic History  . Marital status: Married    Spouse name: Not on file  . Number of children: Not on file  . Years of education: Not on file  . Highest education level: Not on file  Occupational History  . Not on file  Tobacco Use  . Smoking status: Never Smoker  . Smokeless tobacco: Never Used  Vaping Use  . Vaping Use: Never used  Substance and Sexual Activity  . Alcohol use: Yes    Alcohol/week: 6.0 standard drinks    Types: 6 Cans of beer per week    Comment: occasional  . Drug use: No  . Sexual activity: Yes    Comment: bilateral tubal ligation  Other Topics Concern  . Not  on file  Social History Narrative  . Not on file   Social Determinants of Health   Financial Resource Strain:   . Difficulty of Paying Living Expenses: Not on file  Food Insecurity:   . Worried About Charity fundraiser in the Last Year: Not on file  . Ran Out of Food in the Last Year: Not on file  Transportation Needs:   . Lack of Transportation (Medical): Not on file  . Lack of Transportation (Non-Medical): Not on file  Physical Activity:   . Days of Exercise per Week: Not on file  . Minutes of Exercise per Session: Not on file  Stress:   . Feeling of Stress : Not on file  Social Connections:   . Frequency of Communication with Friends and Family: Not on file  . Frequency of Social Gatherings with Friends and Family: Not on file  . Attends Religious Services: Not on file  . Active Member of Clubs or Organizations: Not on file  .  Attends Archivist Meetings: Not on file  . Marital Status: Not on file  Intimate Partner Violence:   . Fear of Current or Ex-Partner: Not on file  . Emotionally Abused: Not on file  . Physically Abused: Not on file  . Sexually Abused: Not on file    Family History  Problem Relation Age of Onset  . Hyperlipidemia Mother   . Cancer Father        lung  . Heart disease Father   . Heart attack Father   . Hyperlipidemia Father   . Diabetes Paternal Uncle   . Cancer Maternal Grandmother        lung  . Hypertension Maternal Grandmother   . Hypertension Maternal Grandfather   . Diabetes Paternal Grandmother   . Heart disease Paternal Grandmother   . Hypertension Paternal Grandmother   . Diabetes Paternal Grandfather   . Hypertension Paternal Grandfather     The following portions of the patient's history were reviewed and updated as appropriate: allergies, current medications, past family history, past medical history, past social history, past surgical history and problem list.  Review of Systems  ROS negative except as noted above. Information obtained from patient.   Objective:   BP 117/81   Pulse (!) 104   Ht 5\' 10"  (1.778 m)   Wt 207 lb 7 oz (94.1 kg)   BMI 29.76 kg/m    CONSTITUTIONAL: Well-developed, well-nourished female in no acute distress.   PHYSICAL EXAM: Not indicated.    Assessment:   1. Hot flashes  - Estradiol - FSH/LH - Progesterone - Testosterone, Free, Total, SHBG  2. Night sweats  - Estradiol - FSH/LH - Progesterone - Testosterone, Free, Total, SHBG  3. Perimenopausal symptom  - Estradiol - FSH/LH - Progesterone - Testosterone, Free, Total, SHBG - Thyroid peroxidase antibody  4. Mood changes  - Estradiol - FSH/LH - Progesterone - Testosterone, Free, Total, SHBG  5. Hair loss  - Estradiol - FSH/LH - Progesterone - Testosterone, Free, Total, SHBG  6. Weight gain  - Thyroid peroxidase antibody  7. Low  libido    Plan:   Labs today, see orders. CNM will contact patient with results.   Discussed symptoms management options, handouts provided.   Reviewed red flag symptoms and when to call.   RTC as previously scheduled or sooner if needed.   Dani Gobble, CNM [facility], [facility] 08/21/20 5:37 PM

## 2020-08-25 LAB — FSH/LH
FSH: 4.4 m[IU]/mL
LH: 4.2 m[IU]/mL

## 2020-08-25 LAB — TESTOSTERONE, FREE, TOTAL, SHBG
Sex Hormone Binding: 98.6 nmol/L (ref 24.6–122.0)
Testosterone, Free: 1.7 pg/mL (ref 0.0–4.2)
Testosterone: 23 ng/dL (ref 8–60)

## 2020-08-25 LAB — ESTRADIOL: Estradiol: 158 pg/mL

## 2020-08-25 LAB — THYROID PEROXIDASE ANTIBODY: Thyroperoxidase Ab SerPl-aCnc: <8 IU/mL (ref 0–34)

## 2020-08-25 LAB — PROGESTERONE: Progesterone: 17.2 ng/mL

## 2020-08-31 ENCOUNTER — Other Ambulatory Visit: Payer: Self-pay | Admitting: Certified Nurse Midwife

## 2020-08-31 DIAGNOSIS — R5383 Other fatigue: Secondary | ICD-10-CM

## 2020-08-31 DIAGNOSIS — L659 Nonscarring hair loss, unspecified: Secondary | ICD-10-CM

## 2020-08-31 DIAGNOSIS — R61 Generalized hyperhidrosis: Secondary | ICD-10-CM

## 2020-08-31 DIAGNOSIS — R232 Flushing: Secondary | ICD-10-CM

## 2020-08-31 NOTE — Progress Notes (Signed)
Referral to Endocrinology per patient request, see orders.    Dani Gobble, CNM [facility], [facility] 08/31/20 12:54 PM

## 2020-09-13 ENCOUNTER — Other Ambulatory Visit: Payer: Self-pay | Admitting: Certified Nurse Midwife

## 2020-09-13 DIAGNOSIS — R5383 Other fatigue: Secondary | ICD-10-CM

## 2020-09-13 DIAGNOSIS — L659 Nonscarring hair loss, unspecified: Secondary | ICD-10-CM

## 2020-09-13 DIAGNOSIS — R232 Flushing: Secondary | ICD-10-CM

## 2020-09-13 DIAGNOSIS — R61 Generalized hyperhidrosis: Secondary | ICD-10-CM

## 2020-09-13 NOTE — Progress Notes (Signed)
Referral to [facility], see chart.    Dani Gobble, CNM [facility], [facility] 09/13/20 6:17 AM

## 2020-09-16 ENCOUNTER — Other Ambulatory Visit: Payer: Self-pay

## 2020-09-26 ENCOUNTER — Telehealth: Payer: Self-pay

## 2020-09-26 NOTE — Telephone Encounter (Signed)
Spoke with Nate at [facility]- Explained that all gyn labs are wnl. Pt is requesting an endocrinology referral.   Nate states he will print her 07/2020 labs for Dr. Gabriel Carina to review. He will call me back if she declines the referral.

## 2020-10-08 ENCOUNTER — Telehealth: Payer: Self-pay

## 2020-10-08 NOTE — Telephone Encounter (Signed)
Per Nate at [facility] endocrinology- they have declined her referral due to no endocrinology issue per the note and labs.   Pt aware. Not happy with this outcome. She states she knows her body and something is wrong.   Apologized to pt and told her to contact me if I can do anything else for her.

## 2020-10-20 ENCOUNTER — Other Ambulatory Visit: Payer: Self-pay

## 2020-10-20 ENCOUNTER — Encounter: Payer: Self-pay | Admitting: Certified Nurse Midwife

## 2020-10-20 ENCOUNTER — Ambulatory Visit (INDEPENDENT_AMBULATORY_CARE_PROVIDER_SITE_OTHER): Payer: BC Managed Care – PPO | Admitting: Certified Nurse Midwife

## 2020-10-20 VITALS — BP 126/83 | HR 86 | Ht 70.0 in | Wt 202.6 lb

## 2020-10-20 DIAGNOSIS — Z01419 Encounter for gynecological examination (general) (routine) without abnormal findings: Secondary | ICD-10-CM

## 2020-10-20 DIAGNOSIS — Z1231 Encounter for screening mammogram for malignant neoplasm of breast: Secondary | ICD-10-CM | POA: Diagnosis not present

## 2020-10-20 NOTE — Patient Instructions (Signed)

## 2020-10-20 NOTE — Progress Notes (Signed)
ANNUAL PREVENTATIVE CARE GYN  ENCOUNTER NOTE  Subjective:       Regina Anderson is a 39 y.o. [phone] female here for a routine annual gynecologic exam.  Current complaints: 1. Hormonal changes; concern regarding possible thyroid problem and frustrated the Endocrinology declined referral-taking Suthe by [employer]anot daily  Declines flu vaccine. Denies difficulty breathing or respiratory distress, chest pain, abdominal pain, excessive vaginal bleeding, dysuria, and leg pain or swelling.    Gynecologic History  Patient's last menstrual period was 10/01/2020 (approximate). Period Cycle (Days): 28 Period Duration (Days): 3 Period Pattern: Regular Menstrual Flow: Light Menstrual Control: Tampon Menstrual Control Change Freq (Hours): 6 8 Dysmenorrhea: None  Contraception: tubal ligation  Last Pap: 07/2018. Results were: Neg/Neg; history abnormal pap  Last mammogram: 04/2014. Results were: BI-RADS 1  Obstetric History OB History  Gravida Para Term Preterm AB Living  3 2 2   1 2   SAB TAB Ectopic Multiple Live Births  1       2    # Outcome Date GA Lbr Len/2nd Weight Sex Delivery Anes PTL Lv  3 Term 10/27/07   7 lb 10 oz (3.459 kg) M CS-Unspec  Y LIV  2 SAB 2007     SAB     1 Term 07/14/00   8 lb 13 oz (3.997 kg) F Vag-Spont  N LIV    Past Medical History:  Diagnosis Date  . Asthma    exercised induced  . Depression   . Headache    menstral migraines  . Migraine headache with aura     Past Surgical History:  Procedure Laterality Date  . CESAREAN SECTION N/A 2008  . CHOLECYSTECTOMY N/A 05/16/2015   Procedure: LAPAROSCOPIC CHOLECYSTECTOMY WITH INTRAOPERATIVE CHOLANGIOGRAM;  Surgeon: Leonie Green, MD;  Location: [facility];  Service: General;  Laterality: N/A;  . DILATION AND CURETTAGE OF UTERUS N/A 2007   miossed ab  . FRACTURE SURGERY Left 2004   left index finger 3 screws  . LEEP  2006  . SHOULDER ARTHROCENTESIS      Current Outpatient Medications on File Prior to  Visit  Medication Sig Dispense Refill  . amitriptyline (ELAVIL) 50 MG tablet Take 50 mg by mouth at bedtime.    . Cholecalciferol (VITAMIN D) 125 MCG (5000 UT) CAPS Take 1 capsule by mouth daily. Recheck lab in 6 months. 180 capsule 0  . EMGALITY 120 MG/ML SOAJ SMARTSIG:1 Milliliter(s) SUB-Q Every 4 Weeks    . UBRELVY 100 MG TABS     . rizatriptan (MAXALT) 10 MG tablet Take 10 mg by mouth 2 (two) times daily as needed.     No current facility-administered medications on file prior to visit.    Allergies  Allergen Reactions  . Macadamia Nut Oil Anaphylaxis  . Other     Other reaction(s): Unknown Uncoded Allergy. Allergen: [place] History   Socioeconomic History  . Marital status: Married    Spouse name: Not on file  . Number of children: Not on file  . Years of education: Not on file  . Highest education level: Not on file  Occupational History  . Not on file  Tobacco Use  . Smoking status: Never Smoker  . Smokeless tobacco: Never Used  Vaping Use  . Vaping Use: Never used  Substance and Sexual Activity  . Alcohol use: Yes    Alcohol/week: 6.0 standard drinks    Types: 6 Cans of beer per week    Comment: socially  .  Drug use: No  . Sexual activity: Yes    Comment: bilateral tubal ligation  Other Topics Concern  . Not on file  Social History Narrative  . Not on file   Social Determinants of Health   Financial Resource Strain:   . Difficulty of Paying Living Expenses: Not on file  Food Insecurity:   . Worried About Charity fundraiser in the Last Year: Not on file  . Ran Out of Food in the Last Year: Not on file  Transportation Needs:   . Lack of Transportation (Medical): Not on file  . Lack of Transportation (Non-Medical): Not on file  Physical Activity:   . Days of Exercise per Week: Not on file  . Minutes of Exercise per Session: Not on file  Stress:   . Feeling of Stress : Not on file  Social Connections:   . Frequency of Communication  with Friends and Family: Not on file  . Frequency of Social Gatherings with Friends and Family: Not on file  . Attends Religious Services: Not on file  . Active Member of Clubs or Organizations: Not on file  . Attends Archivist Meetings: Not on file  . Marital Status: Not on file  Intimate Partner Violence:   . Fear of Current or Ex-Partner: Not on file  . Emotionally Abused: Not on file  . Physically Abused: Not on file  . Sexually Abused: Not on file    Family History  Problem Relation Age of Onset  . Hyperlipidemia Mother   . Cancer Father        lung  . Heart disease Father   . Heart attack Father   . Hyperlipidemia Father   . Diabetes Paternal Uncle   . Cancer Maternal Grandmother        lung  . Hypertension Maternal Grandmother   . Hypertension Maternal Grandfather   . Diabetes Paternal Grandmother   . Heart disease Paternal Grandmother   . Hypertension Paternal Grandmother   . Diabetes Paternal Grandfather   . Hypertension Paternal Grandfather     The following portions of the patient's history were reviewed and updated as appropriate: allergies, current medications, past family history, past medical history, past social history, past surgical history and problem list.  Review of Systems  ROS negative except as noted above. Information obtained from patient.    Objective:   BP 126/83   Pulse 86   Ht 5\' 10"  (1.778 m)   Wt 202 lb 9.6 oz (91.9 kg)   LMP 10/01/2020 (Approximate)   BMI 29.07 kg/m    CONSTITUTIONAL: Well-developed, well-nourished female in no acute distress.   PSYCHIATRIC: Normal mood and affect. Normal behavior. Normal judgment and thought content.  Ideal: Alert and oriented to person, place, and time. Normal muscle tone coordination. No cranial nerve deficit noted.  HENT:  Normocephalic, atraumatic, External right and left ear normal.   EYES: Conjunctivae and EOM are normal. Pupils are equal and round.   NECK: Normal range  of motion, supple, no masses.  Normal thyroid.   SKIN: Skin is warm and dry. No rash noted. Not diaphoretic. No erythema. No pallor. Professional tattoos.   CARDIOVASCULAR: Normal heart rate noted, regular rhythm, no murmur.  RESPIRATORY: Clear to auscultation bilaterally. Effort and breath sounds normal, no problems with respiration noted.  BREASTS: Symmetric in size. No masses, skin changes, nipple drainage, or lymphadenopathy.  ABDOMEN: Soft, normal bowel sounds, no distention noted.  No tenderness, rebound or guarding.   PELVIC:  External Genitalia: Normal  Vagina: Normal  Cervix: Normal  Uterus: Normal  Adnexa: Normal  MUSCULOSKELETAL: Normal range of motion. No tenderness.  No cyanosis, clubbing, or edema.  2+ distal pulses.  LYMPHATIC: No Axillary, Supraclavicular, or Inguinal Adenopathy.  Assessment:   Annual gynecologic examination 39 y.o.   Contraception: tubal ligation   Overweight   Problem List Items Addressed This Visit    None    Visit Diagnoses    Well woman exam    -  Primary   Relevant Orders   MM 3D SCREEN BREAST BILATERAL   Screening mammogram for breast cancer       Relevant Orders   MM 3D SCREEN BREAST BILATERAL      Plan:   Pap: Not needed  Mammogram: Ordered  Labs: Will schedule in three (3) to six (6) months  Routine preventative health maintenance measures emphasized: Exercise/Diet/Weight control, Tobacco Warnings, Alcohol/Substance use risks and Stress Management; see AVS  Reviewed red flag symptoms and when to call  Return to [place] for [employer] or sooner if needed   Dani Gobble, CNM  [facility], [facility] 10/20/20 10:13 AM

## 2020-11-05 ENCOUNTER — Ambulatory Visit: Payer: BC Managed Care – PPO

## 2020-11-19 ENCOUNTER — Other Ambulatory Visit: Payer: Self-pay

## 2020-11-19 ENCOUNTER — Ambulatory Visit
Admission: RE | Admit: 2020-11-19 | Discharge: 2020-11-19 | Disposition: A | Discharge status: Home / Self Care | Payer: BC Managed Care – PPO | Source: Ambulatory Visit | Attending: Certified Nurse Midwife | Admitting: Certified Nurse Midwife

## 2020-11-19 DIAGNOSIS — Z01419 Encounter for gynecological examination (general) (routine) without abnormal findings: Secondary | ICD-10-CM

## 2020-11-19 DIAGNOSIS — Z1231 Encounter for screening mammogram for malignant neoplasm of breast: Secondary | ICD-10-CM

## 2020-11-19 DIAGNOSIS — R928 Other abnormal and inconclusive findings on diagnostic imaging of breast: Secondary | ICD-10-CM | POA: Diagnosis not present

## 2020-11-24 ENCOUNTER — Other Ambulatory Visit: Payer: Self-pay | Admitting: Certified Nurse Midwife

## 2020-11-24 DIAGNOSIS — R928 Other abnormal and inconclusive findings on diagnostic imaging of breast: Secondary | ICD-10-CM

## 2020-11-25 ENCOUNTER — Ambulatory Visit
Admission: RE | Admit: 2020-11-25 | Discharge: 2020-11-25 | Disposition: A | Discharge status: Home / Self Care | Payer: BC Managed Care – PPO | Source: Ambulatory Visit | Attending: Certified Nurse Midwife | Admitting: Certified Nurse Midwife

## 2020-11-25 ENCOUNTER — Other Ambulatory Visit: Payer: Self-pay

## 2020-11-25 DIAGNOSIS — R928 Other abnormal and inconclusive findings on diagnostic imaging of breast: Secondary | ICD-10-CM | POA: Diagnosis present

## 2020-12-25 ENCOUNTER — Ambulatory Visit (INDEPENDENT_AMBULATORY_CARE_PROVIDER_SITE_OTHER): Payer: BC Managed Care – PPO

## 2020-12-25 ENCOUNTER — Ambulatory Visit
Admission: EM | Admit: 2020-12-25 | Discharge: 2020-12-25 | Disposition: A | Discharge status: Home / Self Care | Payer: BC Managed Care – PPO | Attending: Family Medicine | Admitting: Family Medicine

## 2020-12-25 DIAGNOSIS — R059 Cough, unspecified: Secondary | ICD-10-CM | POA: Diagnosis not present

## 2020-12-25 DIAGNOSIS — R079 Chest pain, unspecified: Secondary | ICD-10-CM

## 2020-12-25 DIAGNOSIS — R071 Chest pain on breathing: Secondary | ICD-10-CM | POA: Diagnosis not present

## 2020-12-25 NOTE — ED Provider Notes (Signed)
Roderic Palau    CSN: [id number] Arrival date & time: 12/25/20  1325      History   Chief Complaint Chief Complaint  Patient presents with   Chest Pain    HPI AMNEH CRAIG is a 39 y.o. female.   Patient is a 39 year old female who presents today with central chest pain with moving, breathing and taking a deep breath.  She has had nonproductive cough.  This is been ongoing for the past few days or so feels like is not getting any better.  The pain radiates into the back at times.  No fevers.      Past Medical History:  Diagnosis Date   Asthma    exercised induced   Depression    Headache    menstral migraines   Migraine headache with aura     Patient Active Problem List   Diagnosis Date Noted   Screening for lipid disorders 06/04/2020   Vitamin D deficiency 06/04/2020   Fatigue 06/04/2020   Body mass index 29.0-29.9, adult 06/04/2020   Abnormal skin growth- left upper thigh  06/04/2020   Intractable migraine with aura without status migrainosus 01/29/2016   Major depressive disorder, single episode 01/29/2016   Excessive drinking of alcohol 07/25/2015   Adjustment disorder with depressed mood 07/25/2015   Family history of diverticulitis of colon [id number]   Uncomplicated alcohol abuse 07/25/2015   Depression     Past Surgical History:  Procedure Laterality Date   CESAREAN SECTION N/A 2008   CHOLECYSTECTOMY N/A 05/16/2015   Procedure: LAPAROSCOPIC CHOLECYSTECTOMY WITH INTRAOPERATIVE CHOLANGIOGRAM;  Surgeon: Leonie Green, MD;  Location: [facility];  Service: General;  Laterality: N/A;   [place] OF UTERUS N/A 2007   miossed ab   FRACTURE SURGERY Left 2004   left index finger 3 screws   LEEP  2006   SHOULDER ARTHROCENTESIS      OB History    Gravida  3   Para  2   Term  2   Preterm      AB  1   Living  2     SAB  1   IAB      Ectopic      Multiple      Live Births  2             Home Medications    Prior to Admission medications   Medication Sig Start Date End Date Taking? Authorizing Provider  amitriptyline (ELAVIL) 50 MG tablet Take 50 mg by mouth at bedtime. 05/05/20   [provider]  Cholecalciferol (VITAMIN D) 125 MCG (5000 UT) CAPS Take 1 capsule by mouth daily. Recheck lab in 6 months. 06/05/20   Flinchum, Kelby Aline, FNP  EMGALITY 120 MG/ML SOAJ SMARTSIG:1 Milliliter(s) SUB-Q Every 4 Weeks 05/17/20   [provider]  rizatriptan (MAXALT) 10 MG tablet Take 10 mg by mouth 2 (two) times daily as needed. 09/16/20   [provider]  UBRELVY 100 MG TABS  04/25/20   [provider]    Family History Family History  Problem Relation Age of Onset   Hyperlipidemia Mother    Cancer Father        lung   Heart disease Father    Heart attack Father    Hyperlipidemia Father    Diabetes Paternal Uncle    Cancer Maternal Grandmother        lung   Hypertension Maternal Grandmother    Breast cancer Maternal  Grandmother    Hypertension Maternal Grandfather    Diabetes Paternal Grandmother    Heart disease Paternal Grandmother    Hypertension Paternal Grandmother    Diabetes Paternal Grandfather    Hypertension Paternal Grandfather    Breast cancer Other    Breast cancer Other     Social History Social History   Tobacco Use   Smoking status: Never Smoker   Smokeless tobacco: Never Used  Vaping Use   Vaping Use: Never used  Substance Use Topics   Alcohol use: Yes    Alcohol/week: 6.0 standard drinks    Types: 6 Cans of beer per week    Comment: socially   Drug use: No     Allergies   Macadamia nut oil and Other   Review of Systems Review of Systems   Physical Exam Triage Vital Signs ED Triage Vitals  Enc Vitals Group     BP 12/25/20 1410 122/79     Pulse Rate 12/25/20 1410 100     Resp 12/25/20 1410 16     Temp 12/25/20 1410 98.1 F (36.7 C)     Temp Source 12/25/20 1410 Oral      SpO2 12/25/20 1410 99 %     Weight 12/25/20 1406 205 lb (93 kg)     Height 12/25/20 1406 5\' 10"  (1.778 m)     Head Circumference --      Peak Flow --      Pain Score 12/25/20 1406 7     Pain Loc --      Pain Edu? --      Excl. in GC? --    No data found.  Updated Vital Signs BP 122/79 (BP Location: Left Arm)    Pulse 100    Temp 98.1 F (36.7 C) (Oral)    Resp 16    Ht 5\' 10"  (1.778 m)    Wt 205 lb (93 kg)    LMP 12/18/2020    SpO2 99%    BMI 29.41 kg/m   Visual Acuity Right Eye Distance:   Left Eye Distance:   Bilateral Distance:    Right Eye Near:   Left Eye Near:    Bilateral Near:     Physical Exam Vitals and nursing note reviewed.  Constitutional:      General: She is not in acute distress.    Appearance: Normal appearance. She is not ill-appearing, toxic-appearing or diaphoretic.  HENT:     Head: Normocephalic.     Nose: Nose normal.     Mouth/Throat:     Pharynx: Oropharynx is clear.  Eyes:     Conjunctiva/sclera: Conjunctivae normal.  Cardiovascular:     Rate and Rhythm: Normal rate and regular rhythm.  Pulmonary:     Effort: Pulmonary effort is normal.     Breath sounds: Decreased breath sounds present.  Musculoskeletal:        General: Normal range of motion.     Cervical back: Normal range of motion.  Skin:    General: Skin is warm and dry.     Findings: No rash.  Neurological:     Mental Status: She is alert.  Psychiatric:        Mood and Affect: Mood normal.      UC Treatments / Results  Labs (all labs ordered are listed, but only abnormal results are displayed) Labs Reviewed - No data to display  EKG   Radiology DG Chest 2 View  Result Date: 12/25/2020 CLINICAL DATA:  Chest pain with breathing, cough EXAM: CHEST - 2 VIEW COMPARISON:  None. FINDINGS: The heart size and mediastinal contours are within normal limits. Both lungs are clear. The visualized skeletal structures are unremarkable. IMPRESSION: No active cardiopulmonary  disease. Electronically Signed   By: Kathreen Devoid   On: 12/25/2020 14:52    Procedures Procedures (including critical care time)  Medications Ordered in UC Medications - No data to display  Initial Impression / Assessment and Plan / UC Course  I have reviewed the triage vital signs and the nursing notes.  Pertinent labs & imaging results that were available during my care of the patient were reviewed by me and considered in my medical decision making (see chart for details).     Chest pain EKG with normal sinus rhythm and normal rate.  Chest x ray normal.  Vitals normal Most likely MSK versus GERD.  Recommended ibuprofen every 8 hours for the next few days Also recommended Pepcid  Follow up as needed for continued or worsening symptoms   Final Clinical Impressions(s) / UC Diagnoses   Final diagnoses:  Chest pain, unspecified type     Discharge Instructions     Your EKG and chest x ray were normal Recommend ibuprofen 600 mg every 8 hours.  Try doing some Pepcid for acid reflux.  Follow up as needed for continued or worsening symptoms     ED Prescriptions    None     PDMP not reviewed this encounter.   Orvan July, NP 12/25/20 1535

## 2020-12-25 NOTE — ED Triage Notes (Signed)
Pt reports having some chest discomfort when taking a deep breath. Also reports having a non productive cough.

## 2020-12-25 NOTE — Discharge Instructions (Addendum)
Your EKG and chest x ray were normal Recommend ibuprofen 600 mg every 8 hours.  Try doing some Pepcid for acid reflux.  Follow up as needed for continued or worsening symptoms

## 2021-05-14 ENCOUNTER — Other Ambulatory Visit: Payer: Self-pay

## 2021-05-14 ENCOUNTER — Ambulatory Visit: Payer: BC Managed Care – PPO | Admitting: Family Medicine

## 2021-05-14 VITALS — BP 113/76 | HR 79 | Ht 70.0 in | Wt 213.6 lb

## 2021-05-14 DIAGNOSIS — R5383 Other fatigue: Secondary | ICD-10-CM | POA: Diagnosis not present

## 2021-05-14 DIAGNOSIS — R635 Abnormal weight gain: Secondary | ICD-10-CM | POA: Diagnosis not present

## 2021-05-14 DIAGNOSIS — R61 Generalized hyperhidrosis: Secondary | ICD-10-CM

## 2021-05-14 NOTE — Progress Notes (Signed)
Established patient visit   Patient: Regina Anderson   DOB: [birth year]   40 y.o. Female  MRN: [id number] Visit Date: 05/14/2021  Today's healthcare provider: Vernie Murders, PA-C   Chief Complaint  Patient presents with   Thyroid Problem   Subjective    HPI   Patient presents to get thyroid levels checked after having abnormal (hypothyroidism) thyroid levels at [facility].  Thyroid: Patient presents for evaluation of hypothyroidism. Current symptoms include fatigue, weight gain, change in skin,  nails, or hair, heat intolerance.     Past Medical History:  Diagnosis Date   Asthma    exercised induced   Depression    Headache    menstral migraines   Migraine headache with aura    Past Surgical History:  Procedure Laterality Date   CESAREAN SECTION N/A 2008   CHOLECYSTECTOMY N/A 05/16/2015   Procedure: LAPAROSCOPIC CHOLECYSTECTOMY WITH INTRAOPERATIVE CHOLANGIOGRAM;  Surgeon: Leonie Green, MD;  Location: [facility];  Service: General;  Laterality: N/A;   [place] OF UTERUS N/A 2007   miossed ab   FRACTURE SURGERY Left 2004   left index finger 3 screws   LEEP  2006   SHOULDER ARTHROCENTESIS     Social History   Tobacco Use   Smoking status: Never Smoker   Smokeless tobacco: Never Used  Vaping Use   Vaping Use: Never used  Substance Use Topics   Alcohol use: Yes    Alcohol/week: 6.0 standard drinks    Types: 6 Cans of beer per week    Comment: socially   Drug use: No   Family History  Problem Relation Age of Onset   Hyperlipidemia Mother    Cancer Father        lung   Heart disease Father    Heart attack Father    Hyperlipidemia Father    Diabetes Paternal Uncle    Cancer Maternal Grandmother        lung   Hypertension Maternal Grandmother    Breast cancer Maternal Grandmother    Hypertension Maternal Grandfather    Diabetes Paternal Grandmother    Heart disease Paternal Grandmother    Hypertension Paternal Grandmother    Diabetes  Paternal Grandfather    Hypertension Paternal Grandfather    Breast cancer Other    Breast cancer Other    Allergies  Allergen Reactions   Macadamia Nut Oil Anaphylaxis   Other     Other reaction(s): Unknown Uncoded Allergy. Allergen: MACADAMI NUTS       Medications: Outpatient Medications Prior to Visit  Medication Sig   ketorolac (TORADOL) 10 MG tablet Take 10 mg by mouth every 6 (six) hours as needed.   UBRELVY 100 MG TABS    amitriptyline (ELAVIL) 50 MG tablet Take 50 mg by mouth at bedtime. (Patient not taking: Reported on 05/14/2021)   Cholecalciferol (VITAMIN D) 125 MCG (5000 UT) CAPS Take 1 capsule by mouth daily. Recheck lab in 6 months. (Patient not taking: Reported on 05/14/2021)   EMGALITY 120 MG/ML SOAJ SMARTSIG:1 Milliliter(s) SUB-Q Every 4 Weeks (Patient not taking: Reported on 05/14/2021)   rizatriptan (MAXALT) 10 MG tablet Take 10 mg by mouth 2 (two) times daily as needed. (Patient not taking: Reported on 05/14/2021)   No facility-administered medications prior to visit.    Review of Systems  Constitutional: Negative.   HENT: Negative.    Eyes: Negative.   Respiratory: Negative.    Cardiovascular: Negative.   Gastrointestinal: Negative.  Endocrine:       Night sweats.  Musculoskeletal: Negative.       Objective    BP 113/76 (BP Location: Left Arm, Patient Position: Sitting, Cuff Size: Large)   Pulse 79   Ht 5\' 10"  (1.778 m)   Wt 213 lb 9.6 oz (96.9 kg)   SpO2 100%   BMI 30.65 kg/m  BP Readings from Last 3 Encounters:  05/14/21 113/76  12/25/20 122/79  10/20/20 126/83   Wt Readings from Last 3 Encounters:  05/14/21 213 lb 9.6 oz (96.9 kg)  12/25/20 205 lb (93 kg)  10/20/20 202 lb 9.6 oz (91.9 kg)    Physical Exam Constitutional:      General: She is not in acute distress.    Appearance: She is well-developed.  HENT:     Head: Normocephalic and atraumatic.     Right Ear: Hearing normal.     Left Ear: Hearing normal.     Nose: Nose normal.   Eyes:     General: Lids are normal. No scleral icterus.       Right eye: No discharge.        Left eye: No discharge.     Conjunctiva/sclera: Conjunctivae normal.  Cardiovascular:     Rate and Rhythm: Normal rate and regular rhythm.     Pulses: Normal pulses.     Heart sounds: Normal heart sounds.  Pulmonary:     Effort: Pulmonary effort is normal. No respiratory distress.     Breath sounds: Normal breath sounds.  Abdominal:     General: Bowel sounds are normal.     Palpations: Abdomen is soft.  Musculoskeletal:        General: Normal range of motion.     Cervical back: Normal range of motion and neck supple.  Skin:    Findings: No lesion or rash.  Neurological:     Mental Status: She is alert and oriented to person, place, and time.  Psychiatric:        Speech: Speech normal.        Behavior: Behavior normal.        Thought Content: Thought content normal.      No results found for any visits on 05/14/21.  Assessment & Plan     1. Unexplained night sweats Persistent over the past year with normal monthly menses and normal estrogen, progesterone and testosterone levels per GYN evaluation. Check labs for possible thyroid disease. - CBC with Differential/Platelet - Comprehensive metabolic panel - Thyroid Panel With TSH - Thyroglobulin Level  2. Unexplained weight gain Despite heavy exercise and controlling caloric intake, still unable to lower weight. OB-GYN got a low thyroid antibody test last year. Recheck to rule out Hashimoto's Disease. - CBC with Differential/Platelet - Comprehensive metabolic panel - Thyroid Panel With TSH - Thyroglobulin Level   No follow-ups on file.      I, Zakyla Tonche, PA-C, have reviewed all documentation for this visit. The documentation on 05/14/21 for the exam, diagnosis, procedures, and orders are all accurate and complete.    Vernie Murders, PA-C  [employer] [phone] (phone) [phone] (fax)  [place]

## 2021-05-20 LAB — THYROID PEROXIDASE ANTIBODY: Thyroperoxidase Ab SerPl-aCnc: <8 IU/mL (ref 0–34)

## 2021-05-20 LAB — COMPREHENSIVE METABOLIC PANEL
ALT: 21 IU/L (ref 0–32)
AST: 23 IU/L (ref 0–40)
Albumin/Globulin Ratio: 1.8 (ref 1.2–2.2)
Albumin: 4.6 g/dL (ref 3.8–4.8)
Alkaline Phosphatase: 92 IU/L (ref 44–121)
BUN/Creatinine Ratio: 12 (ref 9–23)
BUN: 9 mg/dL (ref 6–24)
Bilirubin Total: 0.5 mg/dL (ref 0.0–1.2)
CO2: 21 mmol/L (ref 20–29)
Calcium: 9.5 mg/dL (ref 8.7–10.2)
Chloride: 103 mmol/L (ref 96–106)
Creatinine, Ser: 0.74 mg/dL (ref 0.57–1.00)
Globulin, Total: 2.6 g/dL (ref 1.5–4.5)
Glucose: 90 mg/dL (ref 65–99)
Potassium: 4.8 mmol/L (ref 3.5–5.2)
Sodium: 140 mmol/L (ref 134–144)
Total Protein: 7.2 g/dL (ref 6.0–8.5)
eGFR: 104 mL/min/{1.73_m2} (ref >59)

## 2021-05-20 LAB — THYROID PANEL WITH TSH
Free Thyroxine Index: 1.8 (ref 1.2–4.9)
T3 Uptake Ratio: 25 % (ref 24–39)
T4, Total: 7.3 ug/dL (ref 4.5–12.0)
TSH: 1.67 u[IU]/mL (ref 0.450–4.500)

## 2021-05-20 LAB — CBC WITH DIFFERENTIAL/PLATELET
Basophils Absolute: 0 10*3/uL (ref 0.0–0.2)
Basos: 1 %
EOS (ABSOLUTE): 0.1 10*3/uL (ref 0.0–0.4)
Eos: 1 %
Hematocrit: 45.1 % (ref 34.0–46.6)
Hemoglobin: 14.9 g/dL (ref 11.1–15.9)
Immature Grans (Abs): 0 10*3/uL (ref 0.0–0.1)
Immature Granulocytes: 0 %
Lymphocytes Absolute: 1.8 10*3/uL (ref 0.7–3.1)
Lymphs: 32 %
MCH: 30.9 pg (ref 26.6–33.0)
MCHC: 33 g/dL (ref 31.5–35.7)
MCV: 94 fL (ref 79–97)
Monocytes Absolute: 0.4 10*3/uL (ref 0.1–0.9)
Monocytes: 7 %
Neutrophils Absolute: 3.4 10*3/uL (ref 1.4–7.0)
Neutrophils: 59 %
Platelets: 218 10*3/uL (ref 150–450)
RBC: 4.82 x10E6/uL (ref 3.77–5.28)
RDW: 12.1 % (ref 11.7–15.4)
WBC: 5.7 10*3/uL (ref 3.4–10.8)

## 2021-05-20 LAB — THYROGLOBULIN LEVEL: Thyroglobulin (TG-RIA): 23 ng/mL

## 2021-05-21 ENCOUNTER — Telehealth: Payer: Self-pay

## 2021-05-21 NOTE — Telephone Encounter (Signed)
Schedule endocrine referral to [facility] or [facility] to evaluate for subclinical Hashimoto's disease.

## 2021-05-21 NOTE — Telephone Encounter (Signed)
-----   Message from Margo Common, PA-C sent at 05/20/2021  3:53 PM EDT ----- All tests normal. Thyroid antibodies usually go up with Hashimoto's disease/thyroiditis (autoimmune attack to the thyroid). Should consider an endocrinology evaluation at [facility] or [place].

## 2021-05-21 NOTE — Telephone Encounter (Signed)
Patient advised as directed below. Patient states either one [facility] or [facility] works for her for evaluation. Reports that her GYN referred her before to [facility] clinic but they told her labs normal and that endocrine wouldn't see her.

## 2021-07-03 ENCOUNTER — Encounter: Payer: Self-pay | Admitting: Family Medicine

## 2021-07-19 ENCOUNTER — Encounter: Payer: Self-pay | Admitting: Family Medicine

## 2021-09-02 ENCOUNTER — Telehealth: Payer: Self-pay | Admitting: Family Medicine

## 2021-09-02 ENCOUNTER — Other Ambulatory Visit: Payer: Self-pay | Admitting: Family Medicine

## 2021-09-02 DIAGNOSIS — Z01419 Encounter for gynecological examination (general) (routine) without abnormal findings: Secondary | ICD-10-CM

## 2021-09-02 NOTE — Telephone Encounter (Signed)
Referral Request - Has patient seen PCP for this complaint? No  Referral for which specialty: OBGYN  Preferred provider/office: [facility]  Reason for referral: Old OBGYN retired

## 2021-09-02 NOTE — Telephone Encounter (Signed)
Is this for her annual PAP/well woman GYN exam or some specific problem?

## 2021-09-02 NOTE — Telephone Encounter (Signed)
Placed order for referral.

## 2021-10-26 ENCOUNTER — Encounter: Payer: BC Managed Care – PPO | Admitting: Certified Nurse Midwife

## 2021-10-30 ENCOUNTER — Encounter: Payer: Self-pay | Admitting: Obstetrics and Gynecology

## 2021-11-09 NOTE — Progress Notes (Signed)
PCP:  Chrismon, Vickki Muff, PA-C (Inactive)   Chief Complaint  Patient presents with   Gynecologic Exam    Thyroid concerns     HPI:      Ms. Regina Anderson is a 40 y.o. [id number] whose LMP was Patient's last menstrual period was 10/31/2021 (exact date)., presents today for her NP annual examination.  Her menses are regular every 28-30 days, lasting 3 days, light flow.  Dysmenorrhea none. She does not have intermenstrual bleeding. Hx of migraines with ovulation and other times of month. Uses pure CBD oil with sx improvement, takes maxalt prn but much less frequency with CBD use.  Sex activity: single partner, contraception - tubal ligation. No pain/dryness Last Pap: 08/18/18 Results were: no abnormalities /neg HPV DNA  Hx of STDs: HPV with LEEP in 2006; neg paps since  Last mammogram: 11/25/20 Results were: normal after addl views--routine follow-up in 12 months There is a FH of breast and colon cancer in her MGM, genetic testing not done. There is no FH of ovarian cancer. The patient does do self-breast exams.  Tobacco use: The patient denies current or previous tobacco use. Alcohol use: social drinker; had problem with alcohol abuse after loss of MGM, but pt stopped excessive use, doing well No drug use.  Exercise: very active  She does get adequate calcium but not Vitamin D in her diet.  Hx of wt gain over past 4 yrs with lots of exercise, diet changes, without any sx improvement. No FH overweight/obesity. Also has hair loss; thin, brittle nails; dry skin; LE edema. Has had neg thyroid labs for several yrs but pt convinced related to thyroid. Has never done tx. Keeps being told that it's probably her thyroid but levels are normal. Pt very frustrated.  Patient Active Problem List   Diagnosis Date Noted   History of cervical dysplasia 11/10/2021   Screening for lipid disorders 06/04/2020   Vitamin D deficiency 06/04/2020   Fatigue 06/04/2020   Body mass index 29.0-29.9, adult  06/04/2020   Abnormal skin growth- left upper thigh  06/04/2020   Intractable migraine with aura without status migrainosus 01/29/2016   Major depressive disorder, single episode 01/29/2016   Excessive drinking of alcohol 07/25/2015   Adjustment disorder with depressed mood 07/25/2015   Family history of diverticulitis of colon 07/25/2015   Depression     Past Surgical History:  Procedure Laterality Date   CESAREAN SECTION N/A 2008   CHOLECYSTECTOMY N/A 05/16/2015   Procedure: LAPAROSCOPIC CHOLECYSTECTOMY WITH INTRAOPERATIVE CHOLANGIOGRAM;  Surgeon: Leonie Green, MD;  Location: [facility] ORS;  Service: General;  Laterality: N/A;   [place] OF UTERUS N/A 2007   miossed ab   FRACTURE SURGERY Left 2004   left index finger 3 screws   LEEP  2006   SHOULDER ARTHROCENTESIS      Family History  Problem Relation Age of Onset   Hyperlipidemia Mother    Cancer Father        lung   Heart disease Father    Heart attack Father    Hyperlipidemia Father    Diabetes Paternal Uncle    Hypertension Maternal Grandmother    Breast cancer Maternal Grandmother    Colon cancer Maternal Grandmother    Lung cancer Maternal Grandmother    Hypertension Maternal Grandfather    Diabetes Paternal Grandmother    Heart disease Paternal Grandmother    Hypertension Paternal Grandmother    Diabetes Paternal Grandfather    Hypertension Paternal Merchant navy officer  Breast cancer Other    Breast cancer Other     Social History   Socioeconomic History   Marital status: Married    Spouse name: Not on file   Number of children: Not on file   Years of education: Not on file   Highest education level: Not on file  Occupational History   Not on file  Tobacco Use   Smoking status: Never   Smokeless tobacco: Never  Vaping Use   Vaping Use: Never used  Substance and Sexual Activity   Alcohol use: Yes    Alcohol/week: 6.0 standard drinks    Types: 6 Cans of beer per week    Comment:  socially   Drug use: No   Sexual activity: Yes    Birth control/protection: Surgical    Comment: bilateral tubal ligation  Other Topics Concern   Not on file  Social History Narrative   Not on file   Social Determinants of Health   Financial Resource Strain: Not on file  Food Insecurity: Not on file  Transportation Needs: Not on file  Physical Activity: Not on file  Stress: Not on file  Social Connections: Not on file  Intimate Partner Violence: Not on file     Current Outpatient Medications:    ketorolac (TORADOL) 10 MG tablet, Take 10 mg by mouth every 6 (six) hours as needed., Disp: , Rfl:    levothyroxine (SYNTHROID) 25 MCG tablet, Take 1 tablet (25 mcg total) by mouth daily before breakfast., Disp: 30 tablet, Rfl: 0   rizatriptan (MAXALT) 10 MG tablet, Take 10 mg by mouth 2 (two) times daily as needed., Disp: , Rfl:      ROS:  Review of Systems  Constitutional:  Positive for fatigue and unexpected weight change. Negative for fever.  Respiratory:  Negative for cough, shortness of breath and wheezing.   Cardiovascular:  Negative for chest pain, palpitations and leg swelling.  Gastrointestinal:  Negative for blood in stool, constipation, diarrhea, nausea and vomiting.  Endocrine: Positive for heat intolerance. Negative for cold intolerance and polyuria.  Genitourinary:  Negative for dyspareunia, dysuria, flank pain, frequency, genital sores, hematuria, menstrual problem, pelvic pain, urgency, vaginal bleeding, vaginal discharge and vaginal pain.  Musculoskeletal:  Negative for back pain, joint swelling and myalgias.  Skin:  Negative for rash.  Neurological:  Negative for dizziness, syncope, light-headedness, numbness and headaches.  Hematological:  Negative for adenopathy.  Psychiatric/Behavioral:  Negative for agitation, confusion, sleep disturbance and suicidal ideas. The patient is not nervous/anxious.   BREAST: No symptoms   Objective: BP 120/80   Ht 5\' 10"   (1.778 m)   Wt 212 lb (96.2 kg)   LMP 10/31/2021 (Exact Date)   BMI 30.42 kg/m    Physical Exam Constitutional:      Appearance: She is well-developed.  Genitourinary:     Vulva normal.     Right Labia: No rash, tenderness or lesions.    Left Labia: No tenderness, lesions or rash.    No vaginal discharge, erythema or tenderness.      Right Adnexa: not tender and no mass present.    Left Adnexa: not tender and no mass present.    No cervical friability or polyp.     Uterus is not enlarged or tender.  Breasts:    Right: No mass, nipple discharge, skin change or tenderness.     Left: No mass, nipple discharge, skin change or tenderness.  Neck:     Thyroid: No thyromegaly.  Cardiovascular:  Rate and Rhythm: Normal rate and regular rhythm.     Heart sounds: Normal heart sounds. No murmur heard. Pulmonary:     Effort: Pulmonary effort is normal.     Breath sounds: Normal breath sounds.  Abdominal:     Palpations: Abdomen is soft.     Tenderness: There is no abdominal tenderness. There is no guarding or rebound.  Musculoskeletal:        General: Normal range of motion.     Cervical back: Normal range of motion.  Lymphadenopathy:     Cervical: No cervical adenopathy.  Neurological:     General: No focal deficit present.     Mental Status: She is alert and oriented to person, place, and time.     Cranial Nerves: No cranial nerve deficit.  Skin:    General: Skin is warm and dry.  Psychiatric:        Mood and Affect: Mood normal.        Behavior: Behavior normal.        Thought Content: Thought content normal.        Judgment: Judgment normal.  Vitals reviewed.    Assessment/Plan: Encounter for annual routine gynecological examination  Cervical cancer screening - Plan: Cytology - PAP  Screening for HPV (human papillomavirus) - Plan: Cytology - PAP  History of cervical dysplasia - Plan: Cytology - PAP; repeat pap today  Encounter for screening mammogram for  malignant neoplasm of breast - Plan: MM 3D SCREEN BREAST BILATERAL; pt to sched mammo  Family history of breast cancer--MyRisk testing discussed and pt declines today. F/u prn.   Weight gain - Plan: TSH, T4, free, levothyroxine (SYNTHROID) 25 MCG tablet; check labs. Even with normal, will treat with low dose levo for 1 mo then recheck labs (orders placed). If ok, can try for 3 months to see if any sx change. If not, then pt aware sx  are non-thyroid related and need to eval further.   Hair loss - Plan: TSH, T4, free, levothyroxine (SYNTHROID) 25 MCG tablet  Thyroid disorder screening - Plan: TSH, T4, free  Meds ordered this encounter  Medications   levothyroxine (SYNTHROID) 25 MCG tablet    Sig: Take 1 tablet (25 mcg total) by mouth daily before breakfast.    Dispense:  30 tablet    Refill:  0    Order Specific Question:   Supervising Provider    Answer:   Gae Dry [[id number]]              GYN counsel breast self exam, mammography screening, adequate intake of calcium and vitamin D, diet and exercise     F/U  Return in about 1 year (around 11/10/2022).  Regina Anderson B. Tashonda Pinkus, PA-C 11/10/2021 11:13 AM

## 2021-11-10 ENCOUNTER — Other Ambulatory Visit (HOSPITAL_COMMUNITY)
Admission: RE | Admit: 2021-11-10 | Discharge: 2021-11-10 | Disposition: A | Discharge status: Home / Self Care | Payer: BC Managed Care – PPO | Source: Ambulatory Visit | Attending: Obstetrics and Gynecology | Admitting: Obstetrics and Gynecology

## 2021-11-10 ENCOUNTER — Ambulatory Visit (INDEPENDENT_AMBULATORY_CARE_PROVIDER_SITE_OTHER): Payer: BC Managed Care – PPO | Admitting: Obstetrics and Gynecology

## 2021-11-10 ENCOUNTER — Encounter: Payer: Self-pay | Admitting: Obstetrics and Gynecology

## 2021-11-10 ENCOUNTER — Other Ambulatory Visit: Payer: Self-pay

## 2021-11-10 VITALS — BP 120/80 | Ht 70.0 in | Wt 212.0 lb

## 2021-11-10 DIAGNOSIS — Z1329 Encounter for screening for other suspected endocrine disorder: Secondary | ICD-10-CM

## 2021-11-10 DIAGNOSIS — Z1151 Encounter for screening for human papillomavirus (HPV): Secondary | ICD-10-CM | POA: Insufficient documentation

## 2021-11-10 DIAGNOSIS — Z87410 Personal history of cervical dysplasia: Secondary | ICD-10-CM | POA: Insufficient documentation

## 2021-11-10 DIAGNOSIS — Z1231 Encounter for screening mammogram for malignant neoplasm of breast: Secondary | ICD-10-CM

## 2021-11-10 DIAGNOSIS — R635 Abnormal weight gain: Secondary | ICD-10-CM

## 2021-11-10 DIAGNOSIS — Z01419 Encounter for gynecological examination (general) (routine) without abnormal findings: Secondary | ICD-10-CM | POA: Diagnosis not present

## 2021-11-10 DIAGNOSIS — Z124 Encounter for screening for malignant neoplasm of cervix: Secondary | ICD-10-CM | POA: Diagnosis present

## 2021-11-10 DIAGNOSIS — L659 Nonscarring hair loss, unspecified: Secondary | ICD-10-CM

## 2021-11-10 MED ORDER — LEVOTHYROXINE SODIUM 25 MCG PO TABS: 25.0000 ug | ORAL_TABLET | Freq: Every day | ORAL | 0 refills | Status: DC

## 2021-11-10 NOTE — Patient Instructions (Addendum)
I value your feedback and you entrusting us with your care. If you get a [facility] patient survey, I would appreciate you taking the time to let us know about your experience today. Thank you!  [facility]: [phone]      

## 2021-11-11 LAB — T4, FREE: Free T4: 1.19 ng/dL (ref 0.82–1.77)

## 2021-11-11 LAB — TSH: TSH: 1.66 u[IU]/mL (ref 0.450–4.500)

## 2021-11-16 LAB — CYTOLOGY - PAP
Adequacy: ABSENT
Comment: NEGATIVE
Diagnosis: NEGATIVE
High risk HPV: NEGATIVE

## 2021-12-08 ENCOUNTER — Other Ambulatory Visit: Payer: Self-pay

## 2021-12-08 ENCOUNTER — Other Ambulatory Visit: Payer: BC Managed Care – PPO

## 2021-12-08 ENCOUNTER — Ambulatory Visit: Payer: BC Managed Care – PPO | Admitting: Obstetrics and Gynecology

## 2021-12-08 DIAGNOSIS — R635 Abnormal weight gain: Secondary | ICD-10-CM

## 2021-12-08 DIAGNOSIS — L659 Nonscarring hair loss, unspecified: Secondary | ICD-10-CM

## 2021-12-09 LAB — T4, FREE: Free T4: 1.37 ng/dL (ref 0.82–1.77)

## 2021-12-09 LAB — TSH: TSH: 1.27 u[IU]/mL (ref 0.450–4.500)

## 2021-12-09 MED ORDER — LEVOTHYROXINE SODIUM 25 MCG PO TABS: 25.0000 ug | ORAL_TABLET | Freq: Every day | ORAL | 3 refills | Status: DC

## 2021-12-09 NOTE — Progress Notes (Signed)
Rx RF levo 25 mcg. Normal TSH/free T4 labs after 1 mo tx. Pt states she has more energy, her fingernails aren't as brittle and actually growing, no change in wt yet. Feels better, wants to cont. Rx RF till annual 11/23. F/u prn.   Meds ordered this encounter  Medications   levothyroxine (SYNTHROID) 25 MCG tablet    Sig: Take 1 tablet (25 mcg total) by mouth daily before breakfast.    Dispense:  90 tablet    Refill:  3    Order Specific Question:   Supervising Provider    Answer:   Gae Dry [phone]

## 2022-03-04 ENCOUNTER — Ambulatory Visit
Admission: RE | Admit: 2022-03-04 | Discharge: 2022-03-04 | Disposition: A | Discharge status: Home / Self Care | Payer: BC Managed Care – PPO | Source: Ambulatory Visit | Attending: Obstetrics and Gynecology | Admitting: Obstetrics and Gynecology

## 2022-03-04 ENCOUNTER — Other Ambulatory Visit: Payer: Self-pay

## 2022-03-04 DIAGNOSIS — Z1231 Encounter for screening mammogram for malignant neoplasm of breast: Secondary | ICD-10-CM | POA: Diagnosis present

## 2022-09-07 ENCOUNTER — Ambulatory Visit: Payer: BC Managed Care – PPO | Admitting: Nurse Practitioner

## 2022-09-07 ENCOUNTER — Encounter: Payer: Self-pay | Admitting: Nurse Practitioner

## 2022-09-07 VITALS — BP 124/68 | HR 84 | Temp 98.1°F | Resp 16 | Ht 70.0 in | Wt 213.4 lb

## 2022-09-07 DIAGNOSIS — Z131 Encounter for screening for diabetes mellitus: Secondary | ICD-10-CM | POA: Diagnosis not present

## 2022-09-07 DIAGNOSIS — R7989 Other specified abnormal findings of blood chemistry: Secondary | ICD-10-CM

## 2022-09-07 DIAGNOSIS — Z7689 Persons encountering health services in other specified circumstances: Secondary | ICD-10-CM

## 2022-09-07 DIAGNOSIS — E6609 Other obesity due to excess calories: Secondary | ICD-10-CM

## 2022-09-07 DIAGNOSIS — Z6830 Body mass index (BMI) 30.0-30.9, adult: Secondary | ICD-10-CM

## 2022-09-07 DIAGNOSIS — Z13220 Encounter for screening for lipoid disorders: Secondary | ICD-10-CM

## 2022-09-07 DIAGNOSIS — E559 Vitamin D deficiency, unspecified: Secondary | ICD-10-CM

## 2022-09-07 DIAGNOSIS — Z114 Encounter for screening for human immunodeficiency virus [HIV]: Secondary | ICD-10-CM

## 2022-09-07 DIAGNOSIS — G43119 Migraine with aura, intractable, without status migrainosus: Secondary | ICD-10-CM

## 2022-09-07 DIAGNOSIS — Z1159 Encounter for screening for other viral diseases: Secondary | ICD-10-CM

## 2022-09-07 DIAGNOSIS — R5383 Other fatigue: Secondary | ICD-10-CM | POA: Diagnosis not present

## 2022-09-07 DIAGNOSIS — E538 Deficiency of other specified B group vitamins: Secondary | ICD-10-CM | POA: Diagnosis not present

## 2022-09-07 NOTE — Progress Notes (Signed)
BP 124/68   Pulse 84   Temp 98.1 F (36.7 C) (Oral)   Resp 16   Ht '5\' 10"'$  (1.778 m)   Wt 213 lb 6.4 oz (96.8 kg)   SpO2 98%   BMI 30.62 kg/m    Subjective:    Patient ID: Regina Anderson, female    DOB: [birth year], 41 y.o.   MRN: [id number]  HPI: Regina Anderson is a 41 y.o. female  Chief Complaint  Patient presents with   Establish Care   Re-Establish Care   Establish care:  Her last physical was last year.  Her last pap was 11/10/2021.  Labs were done in 04/2021. Her last mammogram was 03/04/2022.  Abnormal TSH:  She says she has had issues with her thyroid. She says that she was having lots of symptoms of thyroid problems including fatigue, weight gain, hair thinning and brittle nails.  She says that her labs were always borderline. She says she was put on a low dose levothyroxine but never noticed a different.  She is not currently taking any medications.    Vitamin b 12/vitamin d deficiency:  She says she has been on b 12 injections but has not had any injections for awhile. Will get lab work.       09/07/2022    1:07 PM 05/14/2021    8:38 AM 06/04/2020   10:08 AM 06/04/2020   10:07 AM  Depression screen PHQ 2/9  Decreased Interest 0 0 0 0  Down, Depressed, Hopeless 0 0 0 0  PHQ - 2 Score 0 0 0 0  Altered sleeping 0 0 3   Tired, decreased energy 0 0 3   Change in appetite 0 0 0   Feeling bad or failure about yourself  0 0 0   Trouble concentrating 0 0 0   Moving slowly or fidgety/restless 0 0 0   Suicidal thoughts 0 0 0   PHQ-9 Score 0 0 6   Difficult doing work/chores Not difficult at all Not difficult at all Not difficult at all      Migraines: She sees Dr. Manuella Ghazi for her migraines. She does CBD drops for her migraines.  She says she has been on it for 2-3 years.  She says it has been helpful.  She says that she has tried multiple treatments. She says she knows her triggers and tries to avoid them.    Obesity:  Her current weight is 213 lbs, with a BMI of 30.62.  She  says she eats healthy, works out but continues to gain weight or not lose weight.   Relevant past medical, surgical, family and social history reviewed and updated as indicated. Interim medical history since our last visit reviewed. Allergies and medications reviewed and updated.  Review of Systems  Constitutional: Negative for fever or weight change.  Respiratory: Negative for cough and shortness of breath.   Cardiovascular: Negative for chest pain or palpitations.  Gastrointestinal: Negative for abdominal pain, no bowel changes.  Musculoskeletal: Negative for gait problem or joint swelling.  Skin: Negative for rash.  Neurological: Negative for dizziness or headache.  No other specific complaints in a complete review of systems (except as listed in HPI above).      Objective:    BP 124/68   Pulse 84   Temp 98.1 F (36.7 C) (Oral)   Resp 16   Ht '5\' 10"'$  (1.778 m)   Wt 213 lb 6.4 oz (96.8 kg)   SpO2 98%  BMI 30.62 kg/m   Wt Readings from Last 3 Encounters:  09/07/22 213 lb 6.4 oz (96.8 kg)  11/10/21 212 lb (96.2 kg)  05/14/21 213 lb 9.6 oz (96.9 kg)    Physical Exam  Constitutional: Patient appears well-developed and well-nourished. Obese  No distress.  HEENT: head atraumatic, normocephalic, pupils equal and reactive to light, neck supple Cardiovascular: Normal rate, regular rhythm and normal heart sounds.  No murmur heard. No BLE edema. Pulmonary/Chest: Effort normal and breath sounds normal. No respiratory distress. Abdominal: Soft.  There is no tenderness. Psychiatric: Patient has a normal mood and affect. behavior is normal. Judgment and thought content normal.  Results for orders placed or performed in visit on 12/08/21  TSH  Result Value Ref Range   TSH 1.270 0.450 - 4.500 uIU/mL  T4, free  Result Value Ref Range   Free T4 1.37 0.82 - 1.77 ng/dL      Assessment & Plan:   Problem List Items Addressed This Visit       Cardiovascular and Mediastinum    Intractable migraine with aura without status migrainosus    Continue follow up appointments with Dr. Manuella Ghazi, neurology.  Continue CBD oil drops to help with migraines.         Other   Vitamin D deficiency    Not currently taking vitamin D supplementation.  We will get labs today.      Relevant Orders   VITAMIN D 25 Hydroxy (Vit-D Deficiency, Fractures)   Fatigue - Primary    Continues to have fatigue.  States she has been struggling with this for years.  We will get labs.      Relevant Orders   CBC with Differential/Platelet   COMPLETE METABOLIC PANEL WITH GFR   Vitamin B12   VITAMIN D 25 Hydroxy (Vit-D Deficiency, Fractures)   Vitamin B 12 deficiency    Currently taking vitamin B12 supplementation.  We will get labs today.      Relevant Orders   Vitamin B12   Abnormal TSH    Patient reports that she used to be on levothyroxine 25 mcg daily.  Patient states she did notice that change in her symptoms.  Patient states her TSH has always been borderline.  We will get labs today.      Relevant Orders   Thyroid Panel With TSH   Class 1 obesity due to excess calories with serious comorbidity and body mass index (BMI) of 30.0 to 30.9 in adult    Patient continues to eat healthy and exercise.  Patient states she has not seen any changes in her weight.  We will be getting labs today.      Other Visit Diagnoses     Screening for diabetes mellitus       Relevant Orders   Hemoglobin A1c   Screening for cholesterol level       Relevant Orders   Lipid panel   Screening for HIV without presence of risk factors       Relevant Orders   HIV Antibody (routine testing w rflx)   Encounter for hepatitis C screening test for low risk patient       Relevant Orders   Hepatitis C antibody   Encounter to establish care       schedule cpe         Follow up plan: Return in about 4 weeks (around 10/05/2022) for cpe.

## 2022-09-07 NOTE — Assessment & Plan Note (Signed)
Patient continues to eat healthy and exercise.  Patient states she has not seen any changes in her weight.  We will be getting labs today.

## 2022-09-07 NOTE — Assessment & Plan Note (Signed)
Patient reports that she used to be on levothyroxine 25 mcg daily.  Patient states she did notice that change in her symptoms.  Patient states her TSH has always been borderline.  We will get labs today.

## 2022-09-07 NOTE — Assessment & Plan Note (Signed)
Continue follow up appointments with Dr. Manuella Ghazi, neurology.  Continue CBD oil drops to help with migraines.

## 2022-09-07 NOTE — Assessment & Plan Note (Signed)
Continues to have fatigue.  States she has been struggling with this for years.  We will get labs.

## 2022-09-07 NOTE — Assessment & Plan Note (Signed)
Currently taking vitamin B12 supplementation.  We will get labs today.

## 2022-09-07 NOTE — Assessment & Plan Note (Signed)
Not currently taking vitamin D supplementation.  We will get labs today.

## 2022-09-08 LAB — CBC WITH DIFFERENTIAL/PLATELET
Absolute Monocytes: 479 cells/uL (ref 200–950)
Basophils Absolute: 32 cells/uL (ref 0–200)
Basophils Relative: 0.5 %
Eosinophils Absolute: 57 cells/uL (ref 15–500)
Eosinophils Relative: 0.9 %
HCT: 41.2 % (ref 35.0–45.0)
Hemoglobin: 14 g/dL (ref 11.7–15.5)
Lymphs Abs: 2111 cells/uL (ref 850–3900)
MCH: 31.3 pg (ref 27.0–33.0)
MCHC: 34 g/dL (ref 32.0–36.0)
MCV: 92 fL (ref 80.0–100.0)
MPV: 11.7 fL (ref 7.5–12.5)
Monocytes Relative: 7.6 %
Neutro Abs: 3623 cells/uL (ref 1500–7800)
Neutrophils Relative %: 57.5 %
Platelets: 211 10*3/uL (ref 140–400)
RBC: 4.48 10*6/uL (ref 3.80–5.10)
RDW: 13.1 % (ref 11.0–15.0)
Total Lymphocyte: 33.5 %
WBC: 6.3 10*3/uL (ref 3.8–10.8)

## 2022-09-08 LAB — THYROID PANEL WITH TSH
Free Thyroxine Index: 2.2 (ref 1.4–3.8)
T3 Uptake: 29 % (ref 22–35)
T4, Total: 7.6 ug/dL (ref 5.1–11.9)
TSH: 1.35 mIU/L

## 2022-09-08 LAB — LIPID PANEL
Cholesterol: 178 mg/dL (ref <200)
HDL: 66 mg/dL (ref >=50)
LDL Cholesterol (Calc): 95 mg/dL (calc)
Non-HDL Cholesterol (Calc): 112 mg/dL (calc) (ref <130)
Total CHOL/HDL Ratio: 2.7 (calc) (ref <5.0)
Triglycerides: 84 mg/dL (ref <150)

## 2022-09-08 LAB — COMPLETE METABOLIC PANEL WITH GFR
AG Ratio: 1.4 (calc) (ref 1.0–2.5)
ALT: 29 U/L (ref 6–29)
AST: 26 U/L (ref 10–30)
Albumin: 4.4 g/dL (ref 3.6–5.1)
Alkaline phosphatase (APISO): 107 U/L (ref 31–125)
BUN: 9 mg/dL (ref 7–25)
CO2: 27 mmol/L (ref 20–32)
Calcium: 9.3 mg/dL (ref 8.6–10.2)
Chloride: 105 mmol/L (ref 98–110)
Creat: 0.66 mg/dL (ref 0.50–0.99)
Globulin: 3.1 g/dL (calc) (ref 1.9–3.7)
Glucose, Bld: 78 mg/dL (ref 65–99)
Potassium: 4.2 mmol/L (ref 3.5–5.3)
Sodium: 141 mmol/L (ref 135–146)
Total Bilirubin: 0.6 mg/dL (ref 0.2–1.2)
Total Protein: 7.5 g/dL (ref 6.1–8.1)
eGFR: 112 mL/min/{1.73_m2} (ref >=60)

## 2022-09-08 LAB — HEMOGLOBIN A1C
Hgb A1c MFr Bld: 4.7 % of total Hgb (ref <5.7)
Mean Plasma Glucose: 88 mg/dL
eAG (mmol/L): 4.9 mmol/L

## 2022-09-08 LAB — VITAMIN B12: Vitamin B-12: 383 pg/mL (ref 200–1100)

## 2022-09-08 LAB — HEPATITIS C ANTIBODY: Hepatitis C Ab: NONREACTIVE

## 2022-09-08 LAB — HIV ANTIBODY (ROUTINE TESTING W REFLEX): HIV 1&2 Ab, 4th Generation: NONREACTIVE

## 2022-09-08 LAB — VITAMIN D 25 HYDROXY (VIT D DEFICIENCY, FRACTURES): Vit D, 25-Hydroxy: 31 ng/mL (ref 30–100)

## 2022-09-20 ENCOUNTER — Encounter: Payer: Self-pay | Admitting: Podiatry

## 2022-09-20 ENCOUNTER — Ambulatory Visit: Payer: BC Managed Care – PPO | Admitting: Podiatry

## 2022-09-20 DIAGNOSIS — L600 Ingrowing nail: Secondary | ICD-10-CM

## 2022-09-20 MED ORDER — NEOMYCIN-POLYMYXIN-HC 1 % OT SOLN: OTIC | 1 refills | Status: DC

## 2022-09-20 NOTE — Progress Notes (Signed)
  Subjective:  Patient ID: Regina Anderson, female    DOB: [birth year],  MRN: [id number] HPI Chief Complaint  Patient presents with   Toe Pain    Hallux left - injured nail, nail fell off, now has grown back and tender at the medial border at tip of toe   New Patient (Initial Visit)    41 y.o. female presents with the above complaint.   ROS: Denies fever chills nausea vomiting muscle aches pains calf pain back pain chest pain shortness of breath.  Past Medical History:  Diagnosis Date   Asthma    exercised induced   Depression    Headache    menstral migraines   Migraine headache with aura    Past Surgical History:  Procedure Laterality Date   CESAREAN SECTION N/A 2008   CHOLECYSTECTOMY N/A 05/16/2015   Procedure: LAPAROSCOPIC CHOLECYSTECTOMY WITH INTRAOPERATIVE CHOLANGIOGRAM;  Surgeon: Leonie Green, MD;  Location: [facility] ORS;  Service: General;  Laterality: N/A;   DILATION AND CURETTAGE OF UTERUS N/A 2007   miossed ab   FRACTURE SURGERY Left 2004   left index finger 3 screws   LEEP  2006   SHOULDER ARTHROCENTESIS      Current Outpatient Medications:    NEOMYCIN-POLYMYXIN-HYDROCORTISONE (CORTISPORIN) 1 % SOLN OTIC solution, Apply 1-2 drops to toe BID after soaking, Disp: 10 mL, Rfl: 1  Allergies  Allergen Reactions   Macadamia Nut Oil Anaphylaxis   Other     Other reaction(s): Unknown Uncoded Allergy. Allergen: Walsh NUTS   Review of Systems Objective:  There were no vitals filed for this visit.  General: Well developed, nourished, in no acute distress, alert and oriented x3   Dermatological: Skin is warm, dry and supple bilateral. Nails x 10 are well maintained; remaining integument appears unremarkable at this time. There are no open sores, no preulcerative lesions, no rash or signs of infection present.  Sharp and abraded nail margin along the tibial border of the hallux left exquisitely tender on palpation.   Vascular: Dorsalis Pedis artery and Posterior  Tibial artery pedal pulses are 2/4 bilateral with immedate capillary fill time. Pedal hair growth present. No varicosities and no lower extremity edema present bilateral.   Neruologic: Grossly intact via light touch bilateral. Vibratory intact via tuning fork bilateral. Protective threshold with Semmes Wienstein monofilament intact to all pedal sites bilateral. Patellar and Achilles deep tendon reflexes 2+ bilateral. No Babinski or clonus noted bilateral.   Musculoskeletal: No gross boney pedal deformities bilateral. No pain, crepitus, or limitation noted with foot and ankle range of motion bilateral. Muscular strength 5/5 in all groups tested bilateral.  Gait: Unassisted, Nonantalgic.    Radiographs:  None taken  Assessment & Plan:   Assessment: Ingrown toenail tibial border hallux left  Plan: Discussed etiology pathology and surgical therapies at this point time chemical matricectomy was performed after local anesthetic was administered she tolerated the procedure well without complications.  Both oral and written home-going structures were provided as well as a prescription for Cortisporin Otic to be applied twice daily after soaking.  I will follow-up with her in 2 weeks.     [name] T. [place], [place]

## 2022-09-20 NOTE — Patient Instructions (Signed)

## 2022-10-05 ENCOUNTER — Ambulatory Visit (INDEPENDENT_AMBULATORY_CARE_PROVIDER_SITE_OTHER): Payer: BC Managed Care – PPO | Admitting: Nurse Practitioner

## 2022-10-05 ENCOUNTER — Encounter: Payer: Self-pay | Admitting: Nurse Practitioner

## 2022-10-05 VITALS — BP 118/80 | HR 83 | Temp 98.1°F | Resp 18 | Ht 70.0 in | Wt 212.7 lb

## 2022-10-05 DIAGNOSIS — E538 Deficiency of other specified B group vitamins: Secondary | ICD-10-CM

## 2022-10-05 DIAGNOSIS — Z0000 Encounter for general adult medical examination without abnormal findings: Secondary | ICD-10-CM | POA: Diagnosis not present

## 2022-10-05 MED ORDER — CYANOCOBALAMIN 1000 MCG/ML IJ SOLN
1000.0000 ug | Freq: Once | INTRAMUSCULAR | Status: AC
  Administered 2022-10-05: 1000 ug via INTRAMUSCULAR

## 2022-10-05 MED ORDER — CYANOCOBALAMIN 1000 MCG/ML IJ SOLN: 1000.0000 ug | INTRAMUSCULAR | 11 refills | Status: DC

## 2022-10-05 NOTE — Progress Notes (Signed)
Name: Regina Anderson   MRN: [id number]    DOB: Jul 21, 1981   Date:10/05/2022       Progress Note  Subjective  Chief Complaint  Chief Complaint  Patient presents with   Annual Exam    HPI  Patient presents for annual CPE.  Diet: low carb, high protein Exercise: [employer] boxing, four days a week for about 1 hour class  Sleep: 6 hours Last dental exam:may 2023 Last eye exam: sometime this year  [facility] Visit from 05/14/2021 in [place]  AUDIT-C Score 4      Depression: Phq 9 is  negative    10/05/2022    1:02 PM 09/07/2022    1:07 PM 05/14/2021    8:38 AM 06/04/2020   10:08 AM 06/04/2020   10:07 AM  Depression screen PHQ 2/9  Decreased Interest 0 0 0 0 0  Down, Depressed, Hopeless 0 0 0 0 0  PHQ - 2 Score 0 0 0 0 0  Altered sleeping 0 0 0 3   Tired, decreased energy 0 0 0 3   Change in appetite 0 0 0 0   Feeling bad or failure about yourself  0 0 0 0   Trouble concentrating 0 0 0 0   Moving slowly or fidgety/restless 0 0 0 0   Suicidal thoughts 0 0 0 0   PHQ-9 Score 0 0 0 6   Difficult doing work/chores Not difficult at all Not difficult at all Not difficult at all Not difficult at all    Hypertension: BP Readings from Last 3 Encounters:  10/05/22 118/80  09/07/22 124/68  11/10/21 120/80   Obesity: Wt Readings from Last 3 Encounters:  10/05/22 212 lb 11.2 oz (96.5 kg)  09/07/22 213 lb 6.4 oz (96.8 kg)  11/10/21 212 lb (96.2 kg)   BMI Readings from Last 3 Encounters:  10/05/22 30.52 kg/m  09/07/22 30.62 kg/m  11/10/21 30.42 kg/m     Vaccines:  HPV: up to at age 45 , ask insurance if age between 110-45  Shingrix: 43-64 yo and ask insurance if covered when patient above 2 yo Pneumonia:  educated and discussed with patient. Flu:  educated and discussed with patient.  Hep C Screening: 09/07/2022 STD testing and prevention (HIV/chl/gon/syphilis): 09/07/2022 Intimate partner violence:none Sexual History : yes but not  currently Menstrual History/LMP/Abnormal Bleeding: [facility]: 09/27/2022, monthly Incontinence Symptoms: none  Breast cancer:  - Last Mammogram: 03/04/2022 - BRCA gene screening: grandma had breast cancer, no BRCA gene testing done  Osteoporosis: Discussed high calcium and vitamin D supplementation, weight bearing exercises  Cervical cancer screening: 11/10/2021  Skin cancer: Discussed monitoring for atypical lesions  Colorectal cancer: no concerns, does not qualify   Lung cancer:   Low Dose CT Chest recommended if Age 73-80 years, 20 pack-year currently smoking OR have quit w/in 15years. Patient does not qualify.   ECG: 12/25/2020  Advanced Care Planning: A voluntary discussion about advance care planning including the explanation and discussion of advance directives.  Discussed health care proxy and Living will, and the patient was able to identify a health care proxy as husband.  Patient does not have a living will at present time. If patient does have living will, I have requested they bring this to the clinic to be scanned in to their chart.  Lipids: Lab Results  Component Value Date   CHOL 178 09/07/2022   CHOL 192 06/04/2020   CHOL 182 08/18/2018   Lab Results  Component  Value Date   HDL 66 09/07/2022   HDL 89 06/04/2020   HDL 84 08/18/2018   Lab Results  Component Value Date   LDLCALC 95 09/07/2022   LDLCALC 93 06/04/2020   LDLCALC 87 08/18/2018   Lab Results  Component Value Date   TRIG 84 09/07/2022   TRIG 53 06/04/2020   TRIG 53 08/18/2018   Lab Results  Component Value Date   CHOLHDL 2.7 09/07/2022   CHOLHDL 2.2 06/04/2020   CHOLHDL 2.2 08/18/2018   No results found for: "LDLDIRECT"  Glucose: Glucose  Date Value Ref Range Status  05/14/2021 90 65 - 99 mg/dL Final  06/04/2020 79 65 - 99 mg/dL Final  08/18/2018 86 65 - 99 mg/dL Final  04/23/2015 102 (H) mg/dL Final    Comment:    65-99 NOTE: New Reference Range  03/04/15    Glucose, Bld  Date Value  Ref Range Status  09/07/2022 78 65 - 99 mg/dL Final    Comment:    .            Fasting reference interval .     Patient Active Problem List   Diagnosis Date Noted   Vitamin B 12 deficiency 09/07/2022   Abnormal TSH 09/07/2022   Class 1 obesity due to excess calories with serious comorbidity and body mass index (BMI) of 30.0 to 30.9 in adult 09/07/2022   History of cervical dysplasia 11/10/2021   Screening for lipid disorders 06/04/2020   Vitamin D deficiency 06/04/2020   Fatigue 06/04/2020   Body mass index 29.0-29.9, adult 06/04/2020   Abnormal skin growth- left upper thigh  06/04/2020   Intractable migraine with aura without status migrainosus 01/29/2016   Family history of diverticulitis of colon 07/25/2015    Past Surgical History:  Procedure Laterality Date   CESAREAN SECTION N/A 2008   CHOLECYSTECTOMY N/A 05/16/2015   Procedure: LAPAROSCOPIC CHOLECYSTECTOMY WITH INTRAOPERATIVE CHOLANGIOGRAM;  Surgeon: Leonie Green, MD;  Location: [facility] ORS;  Service: General;  Laterality: N/A;   [place] OF UTERUS N/A 2007   miossed ab   FRACTURE SURGERY Left 2004   left index finger 3 screws   LEEP  2006   SHOULDER ARTHROCENTESIS      Family History  Problem Relation Age of Onset   Hyperlipidemia Mother    Cancer Father        lung   Heart disease Father    Heart attack Father    Hyperlipidemia Father    Hypertension Maternal Grandmother    Breast cancer Maternal Grandmother 53   Colon cancer Maternal Grandmother 91   Lung cancer Maternal Grandmother    Cancer Maternal Grandmother    Hypertension Maternal Grandfather    Diabetes Paternal Grandmother    Heart disease Paternal Grandmother    Hypertension Paternal Grandmother    Diabetes Paternal Grandfather    Hypertension Paternal Grandfather    Diabetes Paternal Uncle    Breast cancer Other    Breast cancer Other     Social History   Socioeconomic History   Marital status: Married    Spouse  name: Not on file   Number of children: Not on file   Years of education: Not on file   Highest education level: Not on file  Occupational History   Not on file  Tobacco Use   Smoking status: Never   Smokeless tobacco: Never  Vaping Use   Vaping Use: Never used  Substance and Sexual Activity   Alcohol use:  Yes    Alcohol/week: 6.0 standard drinks of alcohol    Types: 6 Cans of beer per week    Comment: socially   Drug use: No   Sexual activity: Yes    Birth control/protection: Surgical    Comment: bilateral tubal ligation  Other Topics Concern   Not on file  Social History Narrative   Not on file   Social Determinants of Health   Financial Resource Strain: Low Risk  (10/05/2022)   Overall Financial Resource Strain (CARDIA)    Difficulty of Paying Living Expenses: Not hard at all  Food Insecurity: No Food Insecurity (10/05/2022)   Hunger Vital Sign    Worried About Running Out of Food in the Last Year: Never true    Ran Out of Food in the Last Year: Never true  Transportation Needs: No Transportation Needs (10/05/2022)   PRAPARE - Hydrologist (Medical): No    Lack of Transportation (Non-Medical): No  Physical Activity: Inactive (10/05/2022)   Exercise Vital Sign    Days of Exercise per Week: 0 days    Minutes of Exercise per Session: 0 min  Stress: No Stress Concern Present (10/05/2022)   [place]    Feeling of Stress : Not at all  Social Connections: Moderately Integrated (10/05/2022)   Social Connection and Isolation Panel [NHANES]    Frequency of Communication with Friends and Family: Twice a week    Frequency of Social Gatherings with Friends and Family: Once a week    Attends Religious Services: 1 to 4 times per year    Active Member of [employer] or Organizations: No    Attends Archivist Meetings: Never    Marital Status: Married  Human resources officer Violence:  Not At Risk (10/05/2022)   Humiliation, Afraid, Rape, and Kick questionnaire    Fear of Current or Ex-Partner: No    Emotionally Abused: No    Physically Abused: No    Sexually Abused: No     Current Outpatient Medications:    cyanocobalamin (VITAMIN B12) 1000 MCG/ML injection, Inject 1 mL (1,000 mcg total) into the muscle every 30 (thirty) days., Disp: 1 mL, Rfl: 11   NEOMYCIN-POLYMYXIN-HYDROCORTISONE (CORTISPORIN) 1 % SOLN OTIC solution, Apply 1-2 drops to toe BID after soaking, Disp: 10 mL, Rfl: 1   rizatriptan (MAXALT) 10 MG tablet, Take by mouth., Disp: , Rfl:   Allergies  Allergen Reactions   Macadamia Nut Oil Anaphylaxis   Other     Other reaction(s): Unknown Uncoded Allergy. Allergen: MACADAMI NUTS     ROS  Constitutional: Negative for fever or weight change.  Respiratory: Negative for cough and shortness of breath.   Cardiovascular: Negative for chest pain or palpitations.  Gastrointestinal: Negative for abdominal pain, no bowel changes.  Musculoskeletal: Negative for gait problem or joint swelling.  Skin: Negative for rash.  Neurological: Negative for dizziness or headache.  No other specific complaints in a complete review of systems (except as listed in HPI above).   Objective  Vitals:   10/05/22 1259  BP: 118/80  Pulse: 83  Resp: 18  Temp: 98.1 F (36.7 C)  SpO2: 99%  Weight: 212 lb 11.2 oz (96.5 kg)  Height: 5' 10"  (1.778 m)    Body mass index is 30.52 kg/m.  Physical Exam  Constitutional: Patient appears well-developed and well-nourished. No distress.  HENT: Head: Normocephalic and atraumatic. Ears: B TMs ok, no erythema or effusion; Nose: Nose  normal. Mouth/Throat: Oropharynx is clear and moist. No oropharyngeal exudate.  Eyes: Conjunctivae and EOM are normal. Pupils are equal, round, and reactive to light. No scleral icterus.  Neck: Normal range of motion. Neck supple. No JVD present. No thyromegaly present.  Cardiovascular: Normal rate,  regular rhythm and normal heart sounds.  No murmur heard. No BLE edema. Pulmonary/Chest: Effort normal and breath sounds normal. No respiratory distress. Abdominal: Soft. Bowel sounds are normal, no distension. There is no tenderness. no masses Musculoskeletal: Normal range of motion, no joint effusions. No gross deformities Neurological: he is alert and oriented to person, place, and time. No cranial nerve deficit. Coordination, balance, strength, speech and gait are normal.  Skin: Skin is warm and dry. No rash noted. No erythema.  Psychiatric: Patient has a normal mood and affect. behavior is normal. Judgment and thought content normal.   Recent Results (from the past 2160 hour(s))  Lipid panel     Status: None   Collection Time: 09/07/22  2:30 PM  Result Value Ref Range   Cholesterol 178 <200 mg/dL   HDL 66 > OR = 50 mg/dL   Triglycerides 84 <150 mg/dL   LDL Cholesterol (Calc) 95 mg/dL (calc)    Comment: Reference range: <100 . Desirable range <100 mg/dL for primary prevention;   <70 mg/dL for patients with CHD or diabetic patients  with > or = 2 CHD risk factors. Marland Kitchen LDL-C is now calculated using the Martin-Hopkins  calculation, which is a validated novel method providing  better accuracy than the Friedewald equation in the  estimation of LDL-C.  Cresenciano Genre et al. Annamaria Helling. 0962;836(62): 2061-2068  (http://education.[facility].com/faq/FAQ164)    Total CHOL/HDL Ratio 2.7 <5.0 (calc)   Non-HDL Cholesterol (Calc) 112 <130 mg/dL (calc)    Comment: For patients with diabetes plus 1 major ASCVD risk  factor, treating to a non-HDL-C goal of <100 mg/dL  (LDL-C of <70 mg/dL) is considered a therapeutic  option.   CBC with Differential/Platelet     Status: None   Collection Time: 09/07/22  2:30 PM  Result Value Ref Range   WBC 6.3 3.8 - 10.8 Thousand/uL   RBC 4.48 3.80 - 5.10 Million/uL   Hemoglobin 14.0 11.7 - 15.5 g/dL   HCT 41.2 35.0 - 45.0 %   MCV 92.0 80.0 - 100.0 fL   MCH  31.3 27.0 - 33.0 pg   MCHC 34.0 32.0 - 36.0 g/dL   RDW 13.1 11.0 - 15.0 %   Platelets 211 140 - 400 Thousand/uL   MPV 11.7 7.5 - 12.5 fL   Neutro Abs 3,623 1,500 - 7,800 cells/uL   Lymphs Abs 2,111 850 - 3,900 cells/uL   Absolute Monocytes 479 200 - 950 cells/uL   Eosinophils Absolute 57 15 - 500 cells/uL   Basophils Absolute 32 0 - 200 cells/uL   Neutrophils Relative % 57.5 %   Total Lymphocyte 33.5 %   Monocytes Relative 7.6 %   Eosinophils Relative 0.9 %   Basophils Relative 0.5 %  COMPLETE METABOLIC PANEL WITH GFR     Status: None   Collection Time: 09/07/22  2:30 PM  Result Value Ref Range   Glucose, Bld 78 65 - 99 mg/dL    Comment: .            Fasting reference interval .    BUN 9 7 - 25 mg/dL   Creat 0.66 0.50 - 0.99 mg/dL   eGFR 112 > OR = 60 mL/min/1.31m   BUN/Creatinine Ratio SEE  NOTE: 6 - 22 (calc)    Comment:    Not Reported: BUN and Creatinine are within    reference range. .    Sodium 141 135 - 146 mmol/L   Potassium 4.2 3.5 - 5.3 mmol/L   Chloride 105 98 - 110 mmol/L   CO2 27 20 - 32 mmol/L   Calcium 9.3 8.6 - 10.2 mg/dL   Total Protein 7.5 6.1 - 8.1 g/dL   Albumin 4.4 3.6 - 5.1 g/dL   Globulin 3.1 1.9 - 3.7 g/dL (calc)   AG Ratio 1.4 1.0 - 2.5 (calc)   Total Bilirubin 0.6 0.2 - 1.2 mg/dL   Alkaline phosphatase (APISO) 107 31 - 125 U/L   AST 26 10 - 30 U/L   ALT 29 6 - 29 U/L  Hemoglobin A1c     Status: None   Collection Time: 09/07/22  2:30 PM  Result Value Ref Range   Hgb A1c MFr Bld 4.7 <5.7 % of total Hgb    Comment: For the purpose of screening for the presence of diabetes: . <5.7%       Consistent with the absence of diabetes 5.7-6.4%    Consistent with increased risk for diabetes             (prediabetes) > or =6.5%  Consistent with diabetes . This assay result is consistent with a decreased risk of diabetes. . Currently, no consensus exists regarding use of hemoglobin A1c for diagnosis of diabetes in children. . According to  American Diabetes Association (ADA) guidelines, hemoglobin A1c <7.0% represents optimal control in non-pregnant diabetic patients. Different metrics may apply to specific patient populations.  Standards of Medical Care in Diabetes(ADA). .    Mean Plasma Glucose 88 mg/dL   eAG (mmol/L) 4.9 mmol/L  Hepatitis C antibody     Status: None   Collection Time: 09/07/22  2:30 PM  Result Value Ref Range   Hepatitis C Ab NON-REACTIVE NON-REACTIVE    Comment: . HCV antibody was non-reactive. There is no laboratory  evidence of HCV infection. . In most cases, no further action is required. However, if recent HCV exposure is suspected, a test for HCV RNA (test code [phone]) is suggested. . For additional information please refer to http://education.[facility].com/faq/FAQ22v1 (This link is being provided for informational/ educational purposes only.) .   HIV Antibody (routine testing w rflx)     Status: None   Collection Time: 09/07/22  2:30 PM  Result Value Ref Range   HIV 1&2 Ab, 4th Generation NON-REACTIVE NON-REACTIVE    Comment: HIV-1 antigen and HIV-1/HIV-2 antibodies were not detected. There is no laboratory evidence of HIV infection. Marland Kitchen PLEASE NOTE: This information has been disclosed to you from records whose confidentiality may be protected by state law.  If your state requires such protection, then the state law prohibits you from making any further disclosure of the information without the specific written consent of the person to whom it pertains, or as otherwise permitted by law. A general authorization for the release of medical or other information is NOT sufficient for this purpose. . For additional information please refer to http://education.[facility].com/faq/FAQ106 (This link is being provided for informational/ educational purposes only.) . Marland Kitchen The performance of this assay has not been clinically validated in patients less than 43 years old. .    Thyroid Panel With TSH     Status: None   Collection Time: 09/07/22  2:30 PM  Result Value Ref Range   T3 Uptake 29 22 -  35 %   T4, Total 7.6 5.1 - 11.9 mcg/dL   Free Thyroxine Index 2.2 1.4 - 3.8   TSH 1.35 mIU/L    Comment:           Reference Range .           > or = 20 Years  0.40-4.50 .                Pregnancy Ranges           First trimester    0.26-2.66           Second trimester   0.55-2.73           Third trimester    0.43-2.91   Vitamin B12     Status: None   Collection Time: 09/07/22  2:30 PM  Result Value Ref Range   Vitamin B-12 383 200 - 1,100 pg/mL    Comment: . Please Note: Although the reference range for vitamin B12 is [phone] pg/mL, it has been reported that between 5 and 10% of patients with values between 200 and 400 pg/mL may experience neuropsychiatric and hematologic abnormalities due to occult B12 deficiency; less than 1% of patients with values above 400 pg/mL will have symptoms. Marland Kitchen   VITAMIN D 25 Hydroxy (Vit-D Deficiency, Fractures)     Status: None   Collection Time: 09/07/22  2:30 PM  Result Value Ref Range   Vit D, 25-Hydroxy 31 30 - 100 ng/mL    Comment: Vitamin D Status         25-OH Vitamin D: . Deficiency:                    <20 ng/mL Insufficiency:             20 - 29 ng/mL Optimal:                 > or = 30 ng/mL . For 25-OH Vitamin D testing on patients on  D2-supplementation and patients for whom quantitation  of D2 and D3 fractions is required, the [facility](TM) 25-OH VIT D, (D2,D3), LC/MS/MS is recommended: order  code [id number] (patients >84yr). . See Note 1 . Note 1 . For additional information, please refer to  http://education.[facility].com/faq/FAQ199  (This link is being provided for informational/ educational purposes only.)       Fall Risk:    10/05/2022    1:02 PM 09/07/2022    1:06 PM 05/14/2021    8:38 AM  Fall Risk   Falls in the past year? 0 0 0  Number falls in past yr: 0 0 0  Injury with  Fall? 0 0 0     Functional Status Survey: Is the patient deaf or have difficulty hearing?: No Does the patient have difficulty seeing, even when wearing glasses/contacts?: No Does the patient have difficulty concentrating, remembering, or making decisions?: No Does the patient have difficulty walking or climbing stairs?: No Does the patient have difficulty dressing or bathing?: No Does the patient have difficulty doing errands alone such as visiting a doctor's office or shopping?: No   Assessment & Plan  1. Annual physical exam Continue with physical activity Eat well balanced and at a calorie deficit Discussed weight loss medication and [facility] weight loss program, patietn is going to think about it.   2. Vitamin B 12 deficiency  - cyanocobalamin (VITAMIN B12) 1000 MCG/ML injection; Inject 1 mL (1,000 mcg total) into the muscle every 30 (thirty) days.  Dispense: 1 mL; Refill: 11 - cyanocobalamin (VITAMIN B12) injection 1,000 mcg   -USPSTF grade A and B recommendations reviewed with patient; age-appropriate recommendations, preventive care, screening tests, etc discussed and encouraged; healthy living encouraged; see AVS for patient education given to patient -Discussed importance of 150 minutes of physical activity weekly, eat two servings of fish weekly, eat one serving of tree nuts ( cashews, pistachios, pecans, almonds.Marland Kitchen) every other day, eat 6 servings of fruit/vegetables daily and drink plenty of water and avoid sweet beverages.

## 2022-10-06 ENCOUNTER — Encounter: Payer: BC Managed Care – PPO | Admitting: Nurse Practitioner

## 2022-10-06 ENCOUNTER — Ambulatory Visit: Payer: BC Managed Care – PPO | Admitting: Podiatry

## 2023-03-03 ENCOUNTER — Ambulatory Visit: Payer: BC Managed Care – PPO | Admitting: Obstetrics and Gynecology

## 2023-03-16 ENCOUNTER — Ambulatory Visit: Payer: BC Managed Care – PPO | Admitting: Podiatry

## 2023-03-24 ENCOUNTER — Ambulatory Visit: Payer: BC Managed Care – PPO | Admitting: Obstetrics and Gynecology

## 2023-04-12 NOTE — Progress Notes (Unsigned)
PCP:  Berniece Salines, FNP   No chief complaint on file.    HPI:      Ms. Regina Anderson is a 42 y.o. [id number] whose LMP was No LMP recorded., presents today for her NP annual examination.  Her menses are regular every 28-30 days, lasting 3 days, light flow.  Dysmenorrhea none. She does not have intermenstrual bleeding. Hx of migraines with ovulation and other times of month. Uses pure CBD oil with sx improvement, takes maxalt prn but much less frequency with CBD use.  Sex activity: single partner, contraception - tubal ligation. No pain/dryness Last Pap: 11/10/21 Results were: no abnormalities /neg HPV DNA  Hx of STDs: HPV with LEEP in 2006; neg paps since  Last mammogram: 03/04/22 Results were: normal after addl views--routine follow-up in 12 months There is a FH of breast and colon cancer in her MGM, genetic testing not done. There is no FH of ovarian cancer. The patient does do self-breast exams.  Tobacco use: The patient denies current or previous tobacco use. Alcohol use: social drinker; had problem with alcohol abuse after loss of MGM, but pt stopped excessive use, doing well No drug use.  Exercise: very active  She does get adequate calcium but not Vitamin D in her diet.  Hx of wt gain over past 4 yrs with lots of exercise, diet changes, without any sx improvement. No FH overweight/obesity. Also has hair loss; thin, brittle nails; dry skin; LE edema. Has had neg thyroid labs for several yrs but pt convinced related to thyroid. Has never done tx. Keeps being told that it's probably her thyroid but levels are normal. Pt very frustrated.  Patient Active Problem List   Diagnosis Date Noted   Vitamin B 12 deficiency 09/07/2022   Abnormal TSH 09/07/2022   Class 1 obesity due to excess calories with serious comorbidity and body mass index (BMI) of 30.0 to 30.9 in adult 09/07/2022   History of cervical dysplasia 11/10/2021   Screening for lipid disorders 06/04/2020   Vitamin D  deficiency 06/04/2020   Fatigue 06/04/2020   Body mass index 29.0-29.9, adult 06/04/2020   Abnormal skin growth- left upper thigh  06/04/2020   Intractable migraine with aura without status migrainosus 01/29/2016   Family history of diverticulitis of colon 07/25/2015    Past Surgical History:  Procedure Laterality Date   CESAREAN SECTION N/A 2008   CHOLECYSTECTOMY N/A 05/16/2015   Procedure: LAPAROSCOPIC CHOLECYSTECTOMY WITH INTRAOPERATIVE CHOLANGIOGRAM;  Surgeon: Nadeen Landau, MD;  Location: [facility] ORS;  Service: General;  Laterality: N/A;   DILATION AND CURETTAGE OF UTERUS N/A 2007   miossed ab   FRACTURE SURGERY Left 2004   left index finger 3 screws   LEEP  2006   SHOULDER ARTHROCENTESIS      Family History  Problem Relation Age of Onset   Hyperlipidemia Mother    Cancer Father        lung   Heart disease Father    Heart attack Father    Hyperlipidemia Father    Hypertension Maternal Grandmother    Breast cancer Maternal Grandmother 29   Colon cancer Maternal Grandmother 54   Lung cancer Maternal Grandmother    Cancer Maternal Grandmother    Hypertension Maternal Grandfather    Diabetes Paternal Grandmother    Heart disease Paternal Grandmother    Hypertension Paternal Grandmother    Diabetes Paternal Grandfather    Hypertension Paternal Grandfather    Diabetes Paternal Uncle  Breast cancer Other    Breast cancer Other     Social History   Socioeconomic History   Marital status: Married    Spouse name: Not on file   Number of children: Not on file   Years of education: Not on file   Highest education level: Not on file  Occupational History   Not on file  Tobacco Use   Smoking status: Never   Smokeless tobacco: Never  Vaping Use   Vaping Use: Never used  Substance and Sexual Activity   Alcohol use: Yes    Alcohol/week: 6.0 standard drinks of alcohol    Types: 6 Cans of beer per week    Comment: socially   Drug use: No   Sexual activity: Yes     Birth control/protection: Surgical    Comment: bilateral tubal ligation  Other Topics Concern   Not on file  Social History Narrative   Not on file   Social Determinants of Health   Financial Resource Strain: Low Risk  (10/05/2022)   Overall Financial Resource Strain (CARDIA)    Difficulty of Paying Living Expenses: Not hard at all  Food Insecurity: No Food Insecurity (10/05/2022)   Hunger Vital Sign    Worried About Running Out of Food in the Last Year: Never true    Ran Out of Food in the Last Year: Never true  Transportation Needs: No Transportation Needs (10/05/2022)   PRAPARE - Administrator, Civil Service (Medical): No    Lack of Transportation (Non-Medical): No  Physical Activity: Inactive (10/05/2022)   Exercise Vital Sign    Days of Exercise per Week: 0 days    Minutes of Exercise per Session: 0 min  Stress: No Stress Concern Present (10/05/2022)   [employer] of Occupational Health - Occupational Stress Questionnaire    Feeling of Stress : Not at all  Social Connections: Moderately Integrated (10/05/2022)   Social Connection and Isolation Panel [NHANES]    Frequency of Communication with Friends and Family: Twice a week    Frequency of Social Gatherings with Friends and Family: Once a week    Attends Religious Services: 1 to 4 times per year    Active Member of [employer] or Organizations: No    Attends Banker Meetings: Never    Marital Status: Married  Catering manager Violence: Not At Risk (10/05/2022)   Humiliation, Afraid, Rape, and Kick questionnaire    Fear of Current or Ex-Partner: No    Emotionally Abused: No    Physically Abused: No    Sexually Abused: No     Current Outpatient Medications:    cyanocobalamin (VITAMIN B12) 1000 MCG/ML injection, Inject 1 mL (1,000 mcg total) into the muscle every 30 (thirty) days., Disp: 1 mL, Rfl: 11   NEOMYCIN-POLYMYXIN-HYDROCORTISONE (CORTISPORIN) 1 % SOLN OTIC solution, Apply 1-2 drops  to toe BID after soaking, Disp: 10 mL, Rfl: 1   rizatriptan (MAXALT) 10 MG tablet, Take by mouth., Disp: , Rfl:      ROS:  Review of Systems  Constitutional:  Positive for fatigue and unexpected weight change. Negative for fever.  Respiratory:  Negative for cough, shortness of breath and wheezing.   Cardiovascular:  Negative for chest pain, palpitations and leg swelling.  Gastrointestinal:  Negative for blood in stool, constipation, diarrhea, nausea and vomiting.  Endocrine: Positive for heat intolerance. Negative for cold intolerance and polyuria.  Genitourinary:  Negative for dyspareunia, dysuria, flank pain, frequency, genital sores, hematuria, menstrual problem, pelvic  pain, urgency, vaginal bleeding, vaginal discharge and vaginal pain.  Musculoskeletal:  Negative for back pain, joint swelling and myalgias.  Skin:  Negative for rash.  Neurological:  Negative for dizziness, syncope, light-headedness, numbness and headaches.  Hematological:  Negative for adenopathy.  Psychiatric/Behavioral:  Negative for agitation, confusion, sleep disturbance and suicidal ideas. The patient is not nervous/anxious.    BREAST: No symptoms   Objective: There were no vitals taken for this visit.   Physical Exam Constitutional:      Appearance: She is well-developed.  Genitourinary:     Vulva normal.     Right Labia: No rash, tenderness or lesions.    Left Labia: No tenderness, lesions or rash.    No vaginal discharge, erythema or tenderness.      Right Adnexa: not tender and no mass present.    Left Adnexa: not tender and no mass present.    No cervical friability or polyp.     Uterus is not enlarged or tender.  Breasts:    Right: No mass, nipple discharge, skin change or tenderness.     Left: No mass, nipple discharge, skin change or tenderness.  Neck:     Thyroid: No thyromegaly.  Cardiovascular:     Rate and Rhythm: Normal rate and regular rhythm.     Heart sounds: Normal heart  sounds. No murmur heard. Pulmonary:     Effort: Pulmonary effort is normal.     Breath sounds: Normal breath sounds.  Abdominal:     Palpations: Abdomen is soft.     Tenderness: There is no abdominal tenderness. There is no guarding or rebound.  Musculoskeletal:        General: Normal range of motion.     Cervical back: Normal range of motion.  Lymphadenopathy:     Cervical: No cervical adenopathy.  Neurological:     General: No focal deficit present.     Mental Status: She is alert and oriented to person, place, and time.     Cranial Nerves: No cranial nerve deficit.  Skin:    General: Skin is warm and dry.  Psychiatric:        Mood and Affect: Mood normal.        Behavior: Behavior normal.        Thought Content: Thought content normal.        Judgment: Judgment normal.  Vitals reviewed.     Assessment/Plan: Encounter for annual routine gynecological examination  Cervical cancer screening - Plan: Cytology - PAP  Screening for HPV (human papillomavirus) - Plan: Cytology - PAP  History of cervical dysplasia - Plan: Cytology - PAP; repeat pap today  Encounter for screening mammogram for malignant neoplasm of breast - Plan: MM 3D SCREEN BREAST BILATERAL; pt to sched mammo  Family history of breast cancer--MyRisk testing discussed and pt declines today. F/u prn.   Weight gain - Plan: TSH, T4, free, levothyroxine (SYNTHROID) 25 MCG tablet; check labs. Even with normal, will treat with low dose levo for 1 mo then recheck labs (orders placed). If ok, can try for 3 months to see if any sx change. If not, then pt aware sx  are non-thyroid related and need to eval further.   Hair loss - Plan: TSH, T4, free, levothyroxine (SYNTHROID) 25 MCG tablet  Thyroid disorder screening - Plan: TSH, T4, free  No orders of the defined types were placed in this encounter.             GYN counsel breast  self exam, mammography screening, adequate intake of calcium and vitamin D, diet and  exercise     F/U  No follow-ups on file.  Shamond Skelton B. Phong Isenberg, PA-C 04/12/2023 11:11 AM

## 2023-04-14 ENCOUNTER — Ambulatory Visit (INDEPENDENT_AMBULATORY_CARE_PROVIDER_SITE_OTHER): Payer: BC Managed Care – PPO | Admitting: Obstetrics and Gynecology

## 2023-04-14 ENCOUNTER — Encounter: Payer: Self-pay | Admitting: Obstetrics and Gynecology

## 2023-04-14 VITALS — BP 118/90 | Ht 70.0 in | Wt 206.0 lb

## 2023-04-14 DIAGNOSIS — Z1231 Encounter for screening mammogram for malignant neoplasm of breast: Secondary | ICD-10-CM

## 2023-04-14 DIAGNOSIS — Z01419 Encounter for gynecological examination (general) (routine) without abnormal findings: Secondary | ICD-10-CM

## 2023-04-14 DIAGNOSIS — Z803 Family history of malignant neoplasm of breast: Secondary | ICD-10-CM | POA: Insufficient documentation

## 2023-04-14 NOTE — Patient Instructions (Addendum)
I value your feedback and you entrusting us with your care. If you get a [facility] patient survey, I would appreciate you taking the time to let us know about your experience today. Thank you!  [facility]: [phone]      

## 2023-05-11 ENCOUNTER — Ambulatory Visit
Admission: RE | Admit: 2023-05-11 | Discharge: 2023-05-11 | Disposition: A | Discharge status: Home / Self Care | Payer: BC Managed Care – PPO | Source: Ambulatory Visit | Attending: Obstetrics and Gynecology | Admitting: Obstetrics and Gynecology

## 2023-05-11 DIAGNOSIS — Z1231 Encounter for screening mammogram for malignant neoplasm of breast: Secondary | ICD-10-CM | POA: Diagnosis present

## 2023-08-10 ENCOUNTER — Encounter: Payer: Self-pay | Admitting: Obstetrics and Gynecology

## 2023-08-30 ENCOUNTER — Encounter: Payer: Self-pay | Admitting: Obstetrics and Gynecology

## 2023-09-12 NOTE — Progress Notes (Unsigned)
Regina Salines, FNP   No chief complaint on file.   HPI:      Regina Anderson is a 42 y.o. [id number] whose LMP was No LMP recorded., presents today for ***  Normal labs 2/23?  Patient Active Problem List   Diagnosis Date Noted   Family history of breast cancer 04/14/2023   Vitamin B 12 deficiency 09/07/2022   Abnormal TSH 09/07/2022   Class 1 obesity due to excess calories with serious comorbidity and body mass index (BMI) of 30.0 to 30.9 in adult 09/07/2022   History of cervical dysplasia 11/10/2021   Screening for lipid disorders 06/04/2020   Vitamin D deficiency 06/04/2020   Fatigue 06/04/2020   Body mass index 29.0-29.9, adult 06/04/2020   Abnormal skin growth- left upper thigh  06/04/2020   Intractable migraine with aura without status migrainosus 01/29/2016   Family history of diverticulitis of colon 07/25/2015    Past Surgical History:  Procedure Laterality Date   CESAREAN SECTION N/A 2008   CHOLECYSTECTOMY N/A 05/16/2015   Procedure: LAPAROSCOPIC CHOLECYSTECTOMY WITH INTRAOPERATIVE CHOLANGIOGRAM;  Surgeon: Nadeen Landau, MD;  Location: [facility] ORS;  Service: General;  Laterality: N/A;   DILATION AND CURETTAGE OF UTERUS N/A 2007   miossed ab   FRACTURE SURGERY Left 2004   left index finger 3 screws   LEEP  2006   SHOULDER ARTHROCENTESIS      Family History  Problem Relation Age of Onset   Hyperlipidemia Mother    Cancer Father        lung   Heart disease Father    Heart attack Father    Hyperlipidemia Father    Diabetes Paternal Uncle    Hypertension Maternal Grandmother    Breast cancer Maternal Grandmother 74   Colon cancer Maternal Grandmother 87   Lung cancer Maternal Grandmother    Cancer Maternal Grandmother    Hypertension Maternal Grandfather    Diabetes Paternal Grandmother    Heart disease Paternal Grandmother    Hypertension Paternal Grandmother    Diabetes Paternal Grandfather    Hypertension Paternal Grandfather    Breast cancer  Other    Breast cancer Other     Social History   Socioeconomic History   Marital status: Married    Spouse name: Not on file   Number of children: Not on file   Years of education: Not on file   Highest education level: Not on file  Occupational History   Not on file  Tobacco Use   Smoking status: Never   Smokeless tobacco: Never  Vaping Use   Vaping status: Never Used  Substance and Sexual Activity   Alcohol use: Yes    Alcohol/week: 6.0 standard drinks of alcohol    Types: 6 Cans of beer per week    Comment: socially   Drug use: No   Sexual activity: Yes    Birth control/protection: Surgical    Comment: bilateral tubal ligation  Other Topics Concern   Not on file  Social History Narrative   Not on file   Social Determinants of Health   Financial Resource Strain: Low Risk  (10/05/2022)   Overall Financial Resource Strain (CARDIA)    Difficulty of Paying Living Expenses: Not hard at all  Food Insecurity: No Food Insecurity (10/05/2022)   Hunger Vital Sign    Worried About Running Out of Food in the Last Year: Never true    Ran Out of Food in the Last Year: Never true  Transportation Needs: No Transportation Needs (10/05/2022)   PRAPARE - Administrator, Civil Service (Medical): No    Lack of Transportation (Non-Medical): No  Physical Activity: Inactive (10/05/2022)   Exercise Vital Sign    Days of Exercise per Week: 0 days    Minutes of Exercise per Session: 0 min  Stress: No Stress Concern Present (10/05/2022)   [employer] of Occupational Health - Occupational Stress Questionnaire    Feeling of Stress : Not at all  Social Connections: Moderately Integrated (10/05/2022)   Social Connection and Isolation Panel [NHANES]    Frequency of Communication with Friends and Family: Twice a week    Frequency of Social Gatherings with Friends and Family: Once a week    Attends Religious Services: 1 to 4 times per year    Active Member of [employer] or  Organizations: No    Attends Banker Meetings: Never    Marital Status: Married  Catering manager Violence: Not At Risk (10/05/2022)   Humiliation, Afraid, Rape, and Kick questionnaire    Fear of Current or Ex-Partner: No    Emotionally Abused: No    Physically Abused: No    Sexually Abused: No    Outpatient Medications Prior to Visit  Medication Sig Dispense Refill   cyanocobalamin (VITAMIN B12) 1000 MCG/ML injection Inject 1 mL (1,000 mcg total) into the muscle every 30 (thirty) days. 1 mL 11   rizatriptan (MAXALT) 10 MG tablet Take by mouth.     No facility-administered medications prior to visit.      ROS:  Review of Systems BREAST: No symptoms   OBJECTIVE:   Vitals:  There were no vitals taken for this visit.  Physical Exam  Results: No results found for this or any previous visit (from the past 24 hour(s)).   Assessment/Plan: No diagnosis found.    No orders of the defined types were placed in this encounter.     No follow-ups on file.  Semaya Vida B. Danaya Geddis, PA-C 09/12/2023 8:01 PM

## 2023-09-13 ENCOUNTER — Encounter: Payer: Self-pay | Admitting: Obstetrics and Gynecology

## 2023-09-13 ENCOUNTER — Ambulatory Visit: Payer: BC Managed Care – PPO | Admitting: Obstetrics and Gynecology

## 2023-09-13 VITALS — BP 110/70 | Ht 69.0 in | Wt 213.0 lb

## 2023-09-13 DIAGNOSIS — Z1329 Encounter for screening for other suspected endocrine disorder: Secondary | ICD-10-CM

## 2023-09-13 DIAGNOSIS — N951 Menopausal and female climacteric states: Secondary | ICD-10-CM | POA: Diagnosis not present

## 2023-09-13 DIAGNOSIS — Z1321 Encounter for screening for nutritional disorder: Secondary | ICD-10-CM

## 2023-09-13 DIAGNOSIS — L659 Nonscarring hair loss, unspecified: Secondary | ICD-10-CM

## 2023-09-13 DIAGNOSIS — Z0000 Encounter for general adult medical examination without abnormal findings: Secondary | ICD-10-CM

## 2023-09-13 DIAGNOSIS — R61 Generalized hyperhidrosis: Secondary | ICD-10-CM | POA: Diagnosis not present

## 2023-09-13 DIAGNOSIS — R7989 Other specified abnormal findings of blood chemistry: Secondary | ICD-10-CM

## 2023-09-13 DIAGNOSIS — Z113 Encounter for screening for infections with a predominantly sexual mode of transmission: Secondary | ICD-10-CM

## 2023-09-13 DIAGNOSIS — R748 Abnormal levels of other serum enzymes: Secondary | ICD-10-CM

## 2023-09-13 DIAGNOSIS — G4709 Other insomnia: Secondary | ICD-10-CM | POA: Diagnosis not present

## 2023-09-13 NOTE — Patient Instructions (Signed)
I value your feedback and you entrusting us with your care. If you get a [facility] patient survey, I would appreciate you taking the time to let us know about your experience today. Thank you! ? ? ?

## 2023-09-15 ENCOUNTER — Encounter: Payer: Self-pay | Admitting: Obstetrics and Gynecology

## 2023-09-15 DIAGNOSIS — N951 Menopausal and female climacteric states: Secondary | ICD-10-CM

## 2023-09-15 DIAGNOSIS — F419 Anxiety disorder, unspecified: Secondary | ICD-10-CM

## 2023-09-15 MED ORDER — VENLAFAXINE HCL ER 37.5 MG PO CP24: 37.5000 mg | ORAL_CAPSULE | Freq: Every day | ORAL | 0 refills | Status: DC

## 2023-09-15 MED ORDER — VENLAFAXINE HCL ER 75 MG PO CP24
75.0000 mg | ORAL_CAPSULE | Freq: Every day | ORAL | 0 refills | Status: DC
Start: 2023-09-15 — End: 2023-11-08

## 2023-09-15 NOTE — Telephone Encounter (Signed)
PHQ-9/GAD-7 on your desk.

## 2023-09-15 NOTE — Addendum Note (Signed)
Addended by: Althea Grimmer B on: 09/15/2023 11:41 AM   Modules accepted: Orders

## 2023-09-15 NOTE — Telephone Encounter (Signed)
GAD-7=10; PHQ-9=10

## 2023-09-15 NOTE — Telephone Encounter (Signed)
Pls call pt to do GAD/PHQ. Thx.

## 2023-10-04 NOTE — Progress Notes (Signed)
Name: Regina Anderson   MRN: [id number]    DOB: 1981/05/27   Date:10/10/2023       Progress Note  Subjective  Chief Complaint  Chief Complaint  Patient presents with   Annual Exam    HPI  Patient presents for annual CPE.  Diet: Regular, currently doing Keto Exercise: None , recommend 150 min of physical activity weekly   Last Eye Exam: February 2024 Last Dental Exam: February 2024  Flowsheet Row Office Visit from 10/10/2023 in [facility]  AUDIT-C Score 0      Depression: Phq 9 is  negative    10/10/2023    2:45 PM 10/10/2023    2:41 PM 10/05/2022    1:02 PM 09/07/2022    1:07 PM 05/14/2021    8:38 AM  Depression screen PHQ 2/9  Decreased Interest 0 0 0 0 0  Down, Depressed, Hopeless 0 0 0 0 0  PHQ - 2 Score 0 0 0 0 0  Altered sleeping 0  0 0 0  Tired, decreased energy 0  0 0 0  Change in appetite 0  0 0 0  Feeling bad or failure about yourself  0  0 0 0  Trouble concentrating 1  0 0 0  Moving slowly or fidgety/restless 0  0 0 0  Suicidal thoughts 0  0 0 0  PHQ-9 Score 1  0 0 0  Difficult doing work/chores   Not difficult at all Not difficult at all Not difficult at all   Hypertension: BP Readings from Last 3 Encounters:  10/10/23 110/72  09/13/23 110/70  04/14/23 (!) 118/90   Obesity: Wt Readings from Last 3 Encounters:  10/10/23 209 lb 6.4 oz (95 kg)  09/13/23 213 lb (96.6 kg)  04/14/23 206 lb (93.4 kg)   BMI Readings from Last 3 Encounters:  10/10/23 30.05 kg/m  09/13/23 31.45 kg/m  04/14/23 29.56 kg/m     Vaccines:  HPV: up to at age 23 , ask insurance if age between 1-45  Shingrix: 23-64 yo and ask insurance if covered when patient above 32 yo Pneumonia:  educated and discussed with patient. Flu:  educated and discussed with patient.     Hep C Screening: completed STD testing and prevention (HIV/chl/gon/syphilis): completed Intimate partner violence: negative screen  Sexual History :sexually active Menstrual  History/LMP/Abnormal Bleeding: [facility]: 09/30/2023 Discussed importance of follow up if any post-menopausal bleeding: not applicable  Incontinence Symptoms: negative for symptoms   Breast cancer:  - Last Mammogram: 05/12/2023 - BRCA gene screening: none, grandma had breast cancer  Osteoporosis Prevention : Discussed high calcium and vitamin D supplementation, weight bearing exercises Bone density :not applicable   Cervical cancer screening: 11/10/2021  Skin cancer: Discussed monitoring for atypical lesions  Colorectal cancer: NA   Lung cancer:  Low Dose CT Chest recommended if Age 45-80 years, 20 pack-year currently smoking OR have quit w/in 15years. Patient does not qualify for screen   ECG: 12/25/2020  Advanced Care Planning: A voluntary discussion about advance care planning including the explanation and discussion of advance directives.  Discussed health care proxy and Living will, and the patient was able to identify a health care proxy as husband.  Patient does not have a living will and power of attorney of health care   Lipids: Lab Results  Component Value Date   CHOL 178 09/07/2022   CHOL 192 06/04/2020   CHOL 182 08/18/2018   Lab Results  Component Value Date  HDL 66 09/07/2022   HDL 89 06/04/2020   HDL 84 08/18/2018   Lab Results  Component Value Date   LDLCALC 95 09/07/2022   LDLCALC 93 06/04/2020   LDLCALC 87 08/18/2018   Lab Results  Component Value Date   TRIG 84 09/07/2022   TRIG 53 06/04/2020   TRIG 53 08/18/2018   Lab Results  Component Value Date   CHOLHDL 2.7 09/07/2022   CHOLHDL 2.2 06/04/2020   CHOLHDL 2.2 08/18/2018   No results found for: "LDLDIRECT"  Glucose: Glucose  Date Value Ref Range Status  09/13/2023 81 70 - 99 mg/dL Final  16/09/9603 90 65 - 99 mg/dL Final  54/08/8118 79 65 - 99 mg/dL Final  14/78/2956 213 (H) mg/dL Final    Comment:    08-65 NOTE: New Reference Range  03/04/15    Glucose, Bld  Date Value Ref Range Status   09/07/2022 78 65 - 99 mg/dL Final    Comment:    .            Fasting reference interval .     Patient Active Problem List   Diagnosis Date Noted   Family history of breast cancer 04/14/2023   Vitamin B 12 deficiency 09/07/2022   Abnormal TSH 09/07/2022   Class 1 obesity due to excess calories with serious comorbidity and body mass index (BMI) of 30.0 to 30.9 in adult 09/07/2022   History of cervical dysplasia 11/10/2021   Screening for lipid disorders 06/04/2020   Vitamin D deficiency 06/04/2020   Fatigue 06/04/2020   Body mass index 29.0-29.9, adult 06/04/2020   Abnormal skin growth- left upper thigh  06/04/2020   Intractable migraine with aura without status migrainosus 01/29/2016   Family history of diverticulitis of colon 07/25/2015    Past Surgical History:  Procedure Laterality Date   CESAREAN SECTION N/A 2008   CHOLECYSTECTOMY N/A 05/16/2015   Procedure: LAPAROSCOPIC CHOLECYSTECTOMY WITH INTRAOPERATIVE CHOLANGIOGRAM;  Surgeon: Nadeen Landau, MD;  Location: [facility] ORS;  Service: General;  Laterality: N/A;   DILATION AND CURETTAGE OF UTERUS N/A 2007   miossed ab   FRACTURE SURGERY Left 2004   left index finger 3 screws   LEEP  2006   SHOULDER ARTHROCENTESIS      Family History  Problem Relation Age of Onset   Hyperlipidemia Mother    Cancer Father        lung   Heart disease Father    Heart attack Father    Hyperlipidemia Father    Diabetes Paternal Uncle    Hypertension Maternal Grandmother    Breast cancer Maternal Grandmother 63   Colon cancer Maternal Grandmother 59   Lung cancer Maternal Grandmother    Cancer Maternal Grandmother    Hypertension Maternal Grandfather    Diabetes Paternal Grandmother    Heart disease Paternal Grandmother    Hypertension Paternal Grandmother    Diabetes Paternal Grandfather    Hypertension Paternal Grandfather    Breast cancer Other    Breast cancer Other     Social History   Socioeconomic History   Marital  status: Married    Spouse name: Not on file   Number of children: Not on file   Years of education: Not on file   Highest education level: GED or equivalent  Occupational History   Not on file  Tobacco Use   Smoking status: Never   Smokeless tobacco: Never  Vaping Use   Vaping status: Never Used  Substance and Sexual Activity  Alcohol use: Yes    Alcohol/week: 6.0 standard drinks of alcohol    Types: 6 Cans of beer per week    Comment: socially   Drug use: No   Sexual activity: Yes    Birth control/protection: Surgical    Comment: bilateral tubal ligation  Other Topics Concern   Not on file  Social History Narrative   Not on file   Social Determinants of Health   Financial Resource Strain: Low Risk  (10/09/2023)   Overall Financial Resource Strain (CARDIA)    Difficulty of Paying Living Expenses: Not hard at all  Food Insecurity: No Food Insecurity (10/10/2023)   Hunger Vital Sign    Worried About Running Out of Food in the Last Year: Never true    Ran Out of Food in the Last Year: Never true  Transportation Needs: No Transportation Needs (10/09/2023)   PRAPARE - Administrator, Civil Service (Medical): No    Lack of Transportation (Non-Medical): No  Physical Activity: Inactive (10/10/2023)   Exercise Vital Sign    Days of Exercise per Week: 0 days    Minutes of Exercise per Session: 0 min  Stress: No Stress Concern Present (10/10/2023)   [employer] of Occupational Health - Occupational Stress Questionnaire    Feeling of Stress : Only a little  Recent Concern: Stress - Stress Concern Present (10/09/2023)   [employer] of Occupational Health - Occupational Stress Questionnaire    Feeling of Stress : To some extent  Social Connections: Moderately Integrated (10/10/2023)   Social Connection and Isolation Panel [NHANES]    Frequency of Communication with Friends and Family: More than three times a week    Frequency of Social Gatherings with  Friends and Family: More than three times a week    Attends Religious Services: 1 to 4 times per year    Active Member of [employer] or Organizations: No    Attends Banker Meetings: Never    Marital Status: Married  Catering manager Violence: Not At Risk (10/10/2023)   Humiliation, Afraid, Rape, and Kick questionnaire    Fear of Current or Ex-Partner: No    Emotionally Abused: No    Physically Abused: No    Sexually Abused: No     Current Outpatient Medications:    cyanocobalamin (VITAMIN B12) 1000 MCG/ML injection, Inject 1 mL (1,000 mcg total) into the muscle every 30 (thirty) days., Disp: 1 mL, Rfl: 11   phentermine (ADIPEX-P) 37.5 MG tablet, Take 1 tablet (37.5 mg total) by mouth daily before breakfast., Disp: 90 tablet, Rfl: 0   rizatriptan (MAXALT) 10 MG tablet, Take by mouth., Disp: , Rfl:    venlafaxine XR (EFFEXOR XR) 75 MG 24 hr capsule, Take 1 capsule (75 mg total) by mouth daily with breakfast., Disp: 90 capsule, Rfl: 0  Allergies  Allergen Reactions   Macadamia Nut Oil Anaphylaxis   Other     Other reaction(s): Unknown Uncoded Allergy. Allergen: MACADAMI NUTS     ROS  Constitutional: Negative for fever or weight change.  Respiratory: Negative for cough and shortness of breath.   Cardiovascular: Negative for chest pain or palpitations.  Gastrointestinal: Negative for abdominal pain, no bowel changes.  Musculoskeletal: Negative for gait problem or joint swelling.  Skin: Negative for rash.  Neurological: Negative for dizziness or headache.  No other specific complaints in a complete review of systems (except as listed in HPI above).   Objective  Vitals:   10/10/23 1442  BP: 110/72  Pulse: 98  Resp: 16  Temp: 97.7 F (36.5 C)  TempSrc: Oral  SpO2: 99%  Weight: 209 lb 6.4 oz (95 kg)  Height: 5\' 10"  (1.778 m)    Body mass index is 30.05 kg/m.  Physical Exam Constitutional: Patient appears well-developed and well-nourished. No distress.   HENT: Head: Normocephalic and atraumatic. Ears: B TMs ok, no erythema or effusion; Nose: Nose normal. Mouth/Throat: Oropharynx is clear and moist. No oropharyngeal exudate.  Eyes: Conjunctivae and EOM are normal. Pupils are equal, round, and reactive to light. No scleral icterus.  Neck: Normal range of motion. Neck supple. No JVD present. No thyromegaly present.  Cardiovascular: Normal rate, regular rhythm and normal heart sounds.  No murmur heard. No BLE edema. Pulmonary/Chest: Effort normal and breath sounds normal. No respiratory distress. Abdominal: Soft. Bowel sounds are normal, no distension. There is no tenderness. no masses Breast: no lumps or masses, no nipple discharge or rashes Musculoskeletal: Normal range of motion, no joint effusions. No gross deformities Neurological: he is alert and oriented to person, place, and time. No cranial nerve deficit. Coordination, balance, strength, speech and gait are normal.  Skin: Skin is warm and dry. No rash noted. No erythema.  Psychiatric: Patient has a normal mood and affect. behavior is normal. Judgment and thought content normal.   Recent Results (from the past 2160 hour(s))  Comprehensive metabolic panel     Status: Abnormal   Collection Time: 09/13/23  9:58 AM  Result Value Ref Range   Glucose 81 70 - 99 mg/dL   BUN 11 6 - 24 mg/dL   Creatinine, Ser 1.61 0.57 - 1.00 mg/dL   eGFR 98 >09 UE/AVW/0.98   BUN/Creatinine Ratio 14 9 - 23   Sodium 139 134 - 144 mmol/L   Potassium 4.5 3.5 - 5.2 mmol/L   Chloride 102 96 - 106 mmol/L   CO2 24 20 - 29 mmol/L   Calcium 9.2 8.7 - 10.2 mg/dL   Total Protein 7.0 6.0 - 8.5 g/dL   Albumin 4.4 3.9 - 4.9 g/dL   Globulin, Total 2.6 1.5 - 4.5 g/dL   Bilirubin Total 0.5 0.0 - 1.2 mg/dL   Alkaline Phosphatase 145 (H) 44 - 121 IU/L   AST 34 0 - 40 IU/L   ALT 63 (H) 0 - 32 IU/L  T4, free     Status: None   Collection Time: 09/13/23  9:58 AM  Result Value Ref Range   Free T4 1.28 0.82 - 1.77 ng/dL   Hemoglobin J1B     Status: None   Collection Time: 09/13/23  9:58 AM  Result Value Ref Range   Hgb A1c MFr Bld 5.0 4.8 - 5.6 %    Comment:          Prediabetes: 5.7 - 6.4          Diabetes: >6.4          Glycemic control for adults with diabetes: <7.0    Est. average glucose Bld gHb Est-mCnc 97 mg/dL  TSH     Status: None   Collection Time: 09/13/23  9:58 AM  Result Value Ref Range   TSH 2.030 0.450 - 4.500 uIU/mL  CBC with Differential/Platelet     Status: None   Collection Time: 09/13/23  9:58 AM  Result Value Ref Range   WBC 6.1 3.4 - 10.8 x10E3/uL   RBC 4.55 3.77 - 5.28 x10E6/uL   Hemoglobin 14.6 11.1 - 15.9 g/dL   Hematocrit 14.7 82.9 - 46.6 %  MCV 96 79 - 97 fL   MCH 32.1 26.6 - 33.0 pg   MCHC 33.3 31.5 - 35.7 g/dL   RDW 16.1 09.6 - 04.5 %   Platelets 219 150 - 450 x10E3/uL   Neutrophils 63 Not Estab. %   Lymphs 28 Not Estab. %   Monocytes 7 Not Estab. %   Eos 1 Not Estab. %   Basos 1 Not Estab. %   Neutrophils Absolute 3.9 1.4 - 7.0 x10E3/uL   Lymphocytes Absolute 1.7 0.7 - 3.1 x10E3/uL   Monocytes Absolute 0.4 0.1 - 0.9 x10E3/uL   EOS (ABSOLUTE) 0.1 0.0 - 0.4 x10E3/uL   Basophils Absolute 0.0 0.0 - 0.2 x10E3/uL   Immature Granulocytes 0 Not Estab. %   Immature Grans (Abs) 0.0 0.0 - 0.1 x10E3/uL  Cortisol     Status: None   Collection Time: 09/13/23  9:58 AM  Result Value Ref Range   Cortisol 6.5 6.2 - 19.4 ug/dL    Comment: Please Note: The reference interval and flagging for  this test is for an AM collection. If this is a PM  collection please use:         Cortisol PM: 2.3-11.9      Fall Risk:    10/10/2023    2:41 PM 10/05/2022    1:02 PM 09/07/2022    1:06 PM 05/14/2021    8:38 AM  Fall Risk   Falls in the past year? 0 0 0 0  Number falls in past yr: 0 0 0 0  Injury with Fall? 0 0 0 0  Risk for fall due to : No Fall Risks     Follow up Falls prevention discussed        Functional Status Survey: Is the patient deaf or have difficulty  hearing?: No Does the patient have difficulty seeing, even when wearing glasses/contacts?: No Does the patient have difficulty concentrating, remembering, or making decisions?: No Does the patient have difficulty walking or climbing stairs?: No Does the patient have difficulty dressing or bathing?: No Does the patient have difficulty doing errands alone such as visiting a doctor's office or shopping?: No   Assessment & Plan  1. Annual physical exam Eating keto diet Increase physical activity, recommend 150 min of physical activity weekly    Recently had labs  2. Class 1 obesity due to excess calories with serious comorbidity and body mass index (BMI) of 30.0 to 30.9 in adult  - phentermine (ADIPEX-P) 37.5 MG tablet; Take 1 tablet (37.5 mg total) by mouth daily before breakfast.  Dispense: 90 tablet; Refill: 0   -USPSTF grade A and B recommendations reviewed with patient; age-appropriate recommendations, preventive care, screening tests, etc discussed and encouraged; healthy living encouraged; see AVS for patient education given to patient -Discussed importance of 150 minutes of physical activity weekly, eat two servings of fish weekly, eat one serving of tree nuts ( cashews, pistachios, pecans, almonds.Marland Kitchen) every other day, eat 6 servings of fruit/vegetables daily and drink plenty of water and avoid sweet beverages.   -Reviewed Health Maintenance: Yes.

## 2023-10-07 ENCOUNTER — Encounter: Payer: BC Managed Care – PPO | Admitting: Nurse Practitioner

## 2023-10-10 ENCOUNTER — Other Ambulatory Visit: Payer: Self-pay

## 2023-10-10 ENCOUNTER — Ambulatory Visit: Payer: BC Managed Care – PPO | Admitting: Nurse Practitioner

## 2023-10-10 ENCOUNTER — Encounter: Payer: Self-pay | Admitting: Nurse Practitioner

## 2023-10-10 VITALS — BP 110/72 | HR 98 | Temp 97.7°F | Resp 16 | Ht 70.0 in | Wt 209.4 lb

## 2023-10-10 DIAGNOSIS — E66811 Obesity, class 1: Secondary | ICD-10-CM

## 2023-10-10 DIAGNOSIS — Z6830 Body mass index (BMI) 30.0-30.9, adult: Secondary | ICD-10-CM | POA: Diagnosis not present

## 2023-10-10 DIAGNOSIS — Z0000 Encounter for general adult medical examination without abnormal findings: Secondary | ICD-10-CM

## 2023-10-10 DIAGNOSIS — Z0001 Encounter for general adult medical examination with abnormal findings: Secondary | ICD-10-CM | POA: Diagnosis not present

## 2023-10-10 DIAGNOSIS — E6609 Other obesity due to excess calories: Secondary | ICD-10-CM

## 2023-10-10 MED ORDER — PHENTERMINE HCL 37.5 MG PO TABS: 37.5000 mg | ORAL_TABLET | Freq: Every day | ORAL | 0 refills | Status: DC

## 2023-10-26 ENCOUNTER — Other Ambulatory Visit: Payer: Self-pay | Admitting: Nurse Practitioner

## 2023-10-26 DIAGNOSIS — E538 Deficiency of other specified B group vitamins: Secondary | ICD-10-CM

## 2023-10-26 NOTE — Telephone Encounter (Signed)
Requested medication (s) are due for refill today: yes  Requested medication (s) are on the active medication list: yes    Last refill: 10/05/22  1ml     11 refills  Future visit scheduled yes 09/1624  Notes to clinic:Failed due to labs, please review. Thank you.  Requested Prescriptions  Pending Prescriptions Disp Refills   cyanocobalamin (VITAMIN B12) 1000 MCG/ML injection [Pharmacy Med Name: CYANOCOBALAMIN 1000MCG/ML INJ, 1ML] 1 mL 11    Sig: ADMINISTER 1 ML(1000 MCG) IN THE MUSCLE EVERY 30 DAYS     Endocrinology:  Vitamins - Vitamin B12 Failed - 10/26/2023 10:09 AM      Failed - B12 Level in normal range and within 360 days    Vitamin B-12  Date Value Ref Range Status  09/07/2022 383 200 - 1,100 pg/mL Final    Comment:    . Please Note: Although the reference range for vitamin B12 is 9864269722 pg/mL, it has been reported that between 5 and 10% of patients with values between 200 and 400 pg/mL may experience neuropsychiatric and hematologic abnormalities due to occult B12 deficiency; less than 1% of patients with values above 400 pg/mL will have symptoms. .          Passed - HCT in normal range and within 360 days    Hematocrit  Date Value Ref Range Status  09/13/2023 43.8 34.0 - 46.6 % Final         Passed - HGB in normal range and within 360 days    Hemoglobin  Date Value Ref Range Status  09/13/2023 14.6 11.1 - 15.9 g/dL Final         Passed - Valid encounter within last 12 months    Recent Outpatient Visits           2 weeks ago Annual physical exam   [facility] Berniece Salines, FNP   1 year ago Annual physical exam   [facility] Berniece Salines, FNP   1 year ago Fatigue, unspecified type   [facility] Berniece Salines, FNP   2 years ago Unexplained night sweats   [facility] Chrismon, Jodell Cipro, PA-C   3 years ago Routine adult health maintenance    [facility] Flinchum, Eula Fried, FNP       Future Appointments             In 11 months Zane Herald, Rudolpho Sevin, FNP [facility], [facility]

## 2023-11-08 ENCOUNTER — Encounter: Payer: Self-pay | Admitting: Obstetrics and Gynecology

## 2023-11-08 ENCOUNTER — Other Ambulatory Visit: Payer: Self-pay | Admitting: Obstetrics and Gynecology

## 2023-11-08 DIAGNOSIS — N951 Menopausal and female climacteric states: Secondary | ICD-10-CM

## 2023-11-08 DIAGNOSIS — F419 Anxiety disorder, unspecified: Secondary | ICD-10-CM

## 2023-11-08 MED ORDER — VENLAFAXINE HCL ER 150 MG PO CP24: 150.0000 mg | ORAL_CAPSULE | Freq: Every day | ORAL | 0 refills | Status: DC

## 2023-11-08 NOTE — Progress Notes (Signed)
Rx effexor increase to 150 mg. F/u in 4 wks re: sx.

## 2023-11-30 ENCOUNTER — Ambulatory Visit: Payer: BC Managed Care – PPO | Admitting: Nurse Practitioner

## 2023-11-30 ENCOUNTER — Encounter: Payer: Self-pay | Admitting: Nurse Practitioner

## 2023-11-30 VITALS — BP 114/86 | HR 84 | Temp 97.8°F | Resp 16 | Ht 69.0 in | Wt 202.4 lb

## 2023-11-30 DIAGNOSIS — G8929 Other chronic pain: Secondary | ICD-10-CM

## 2023-11-30 DIAGNOSIS — M5441 Lumbago with sciatica, right side: Secondary | ICD-10-CM | POA: Diagnosis not present

## 2023-11-30 MED ORDER — GABAPENTIN 100 MG PO CAPS: 100.0000 mg | ORAL_CAPSULE | Freq: Three times a day (TID) | ORAL | 3 refills | Status: AC

## 2023-11-30 MED ORDER — TIZANIDINE HCL 4 MG PO TABS: 2.0000 mg | ORAL_TABLET | Freq: Three times a day (TID) | ORAL | 2 refills | Status: AC | PRN

## 2023-11-30 NOTE — Patient Instructions (Signed)
Scheduling MRI: [phone]
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
## 2023-12-06 ENCOUNTER — Ambulatory Visit
Admission: RE | Admit: 2023-12-06 | Discharge: 2023-12-06 | Disposition: A | Discharge status: Home / Self Care | Payer: BC Managed Care – PPO | Source: Ambulatory Visit | Attending: Nurse Practitioner | Admitting: Nurse Practitioner

## 2023-12-06 DIAGNOSIS — M5441 Lumbago with sciatica, right side: Secondary | ICD-10-CM | POA: Insufficient documentation

## 2023-12-06 DIAGNOSIS — G8929 Other chronic pain: Secondary | ICD-10-CM | POA: Diagnosis present

## 2023-12-19 DIAGNOSIS — R7989 Other specified abnormal findings of blood chemistry: Secondary | ICD-10-CM

## 2023-12-26 ENCOUNTER — Other Ambulatory Visit: Payer: Self-pay | Admitting: Obstetrics and Gynecology

## 2023-12-26 ENCOUNTER — Encounter: Payer: Self-pay | Admitting: Obstetrics and Gynecology

## 2023-12-26 DIAGNOSIS — F419 Anxiety disorder, unspecified: Secondary | ICD-10-CM

## 2023-12-26 DIAGNOSIS — N951 Menopausal and female climacteric states: Secondary | ICD-10-CM

## 2023-12-27 NOTE — Telephone Encounter (Signed)
 Pls call pt to do GAD/PHQ. Does she have hx of ADD/ADHD? Is she sleeping? Using unisom/any sleep meds?

## 2023-12-28 IMAGING — MG MM DIGITAL SCREENING BILAT W/ TOMO AND CAD
8 series · 8 of 24 positions shown · non-contrast
Comparison: Previous exam(s).

CLINICAL DATA: Screening.

EXAM:
DIGITAL SCREENING BILATERAL MAMMOGRAM WITH TOMOSYNTHESIS AND CAD
TECHNIQUE: Bilateral screening digital craniocaudal and mediolateral oblique
mammograms were obtained. Bilateral screening digital breast
tomosynthesis was performed. The images were evaluated with
computer-aided detection.

[R MLO synth-2D]
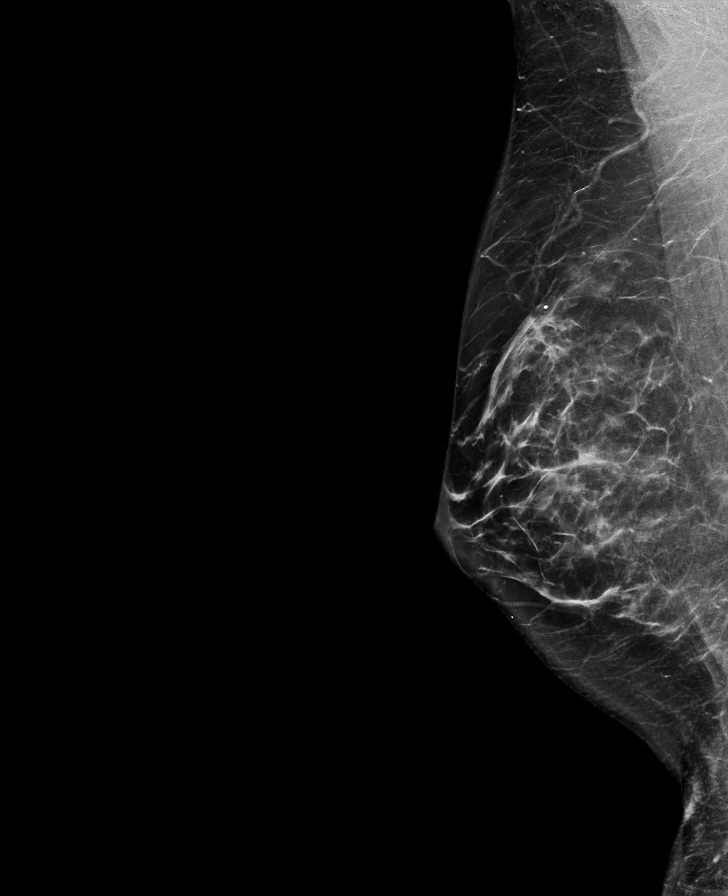

[R CC synth-2D]
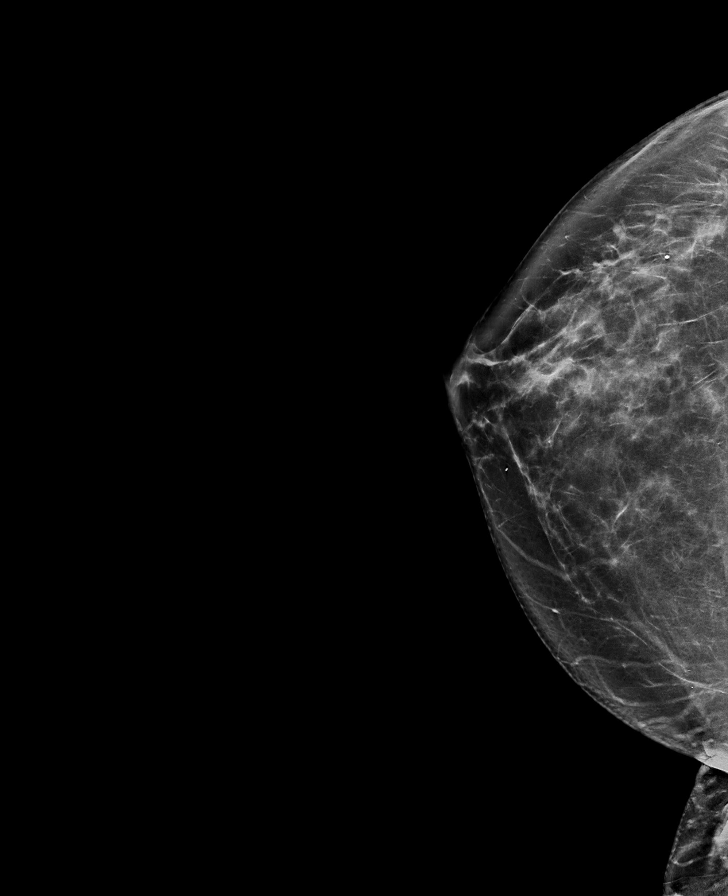

[L CC synth-2D]
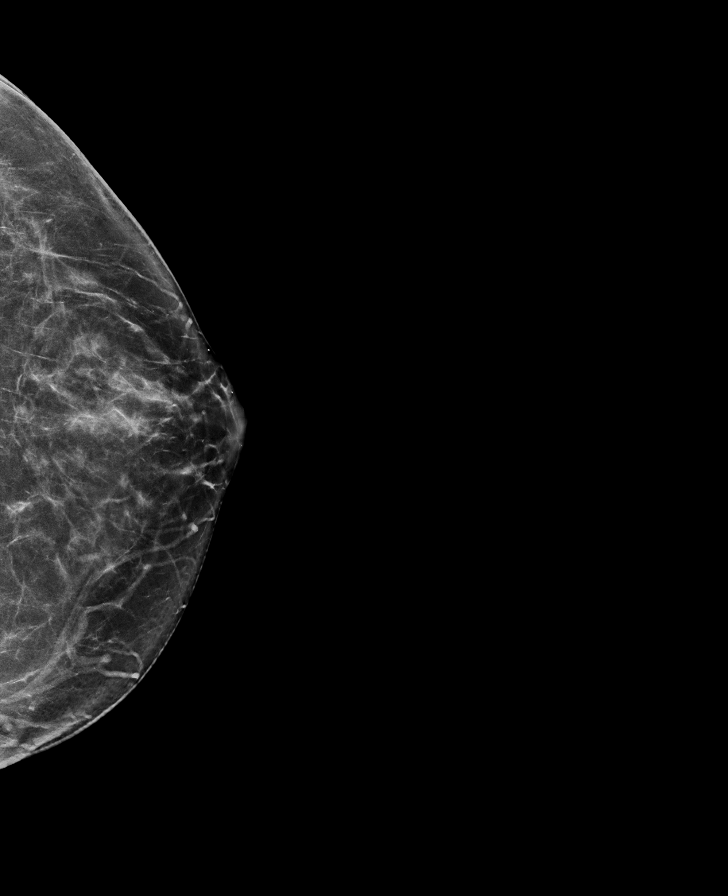

[L MLO synth-2D]
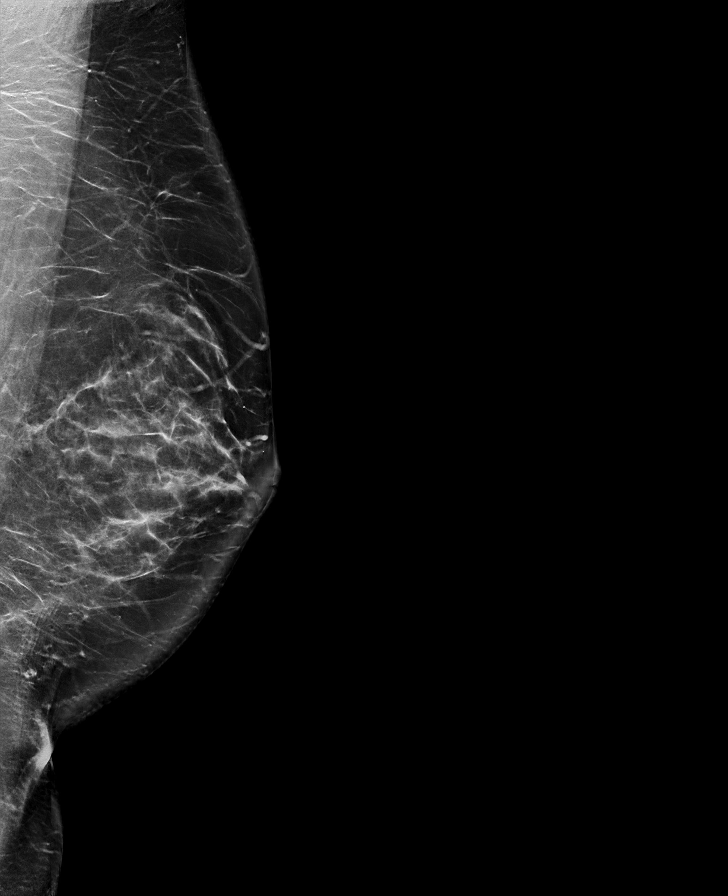

[R MLO tomo · tomo slice 39/77.0]
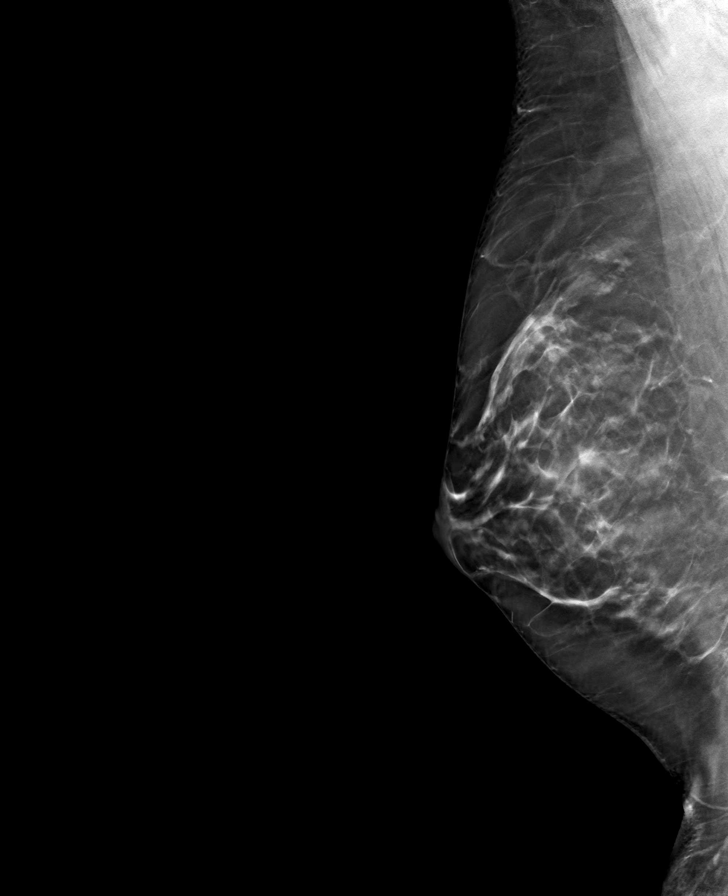

[L CC tomo · tomo slice 39/77.0]
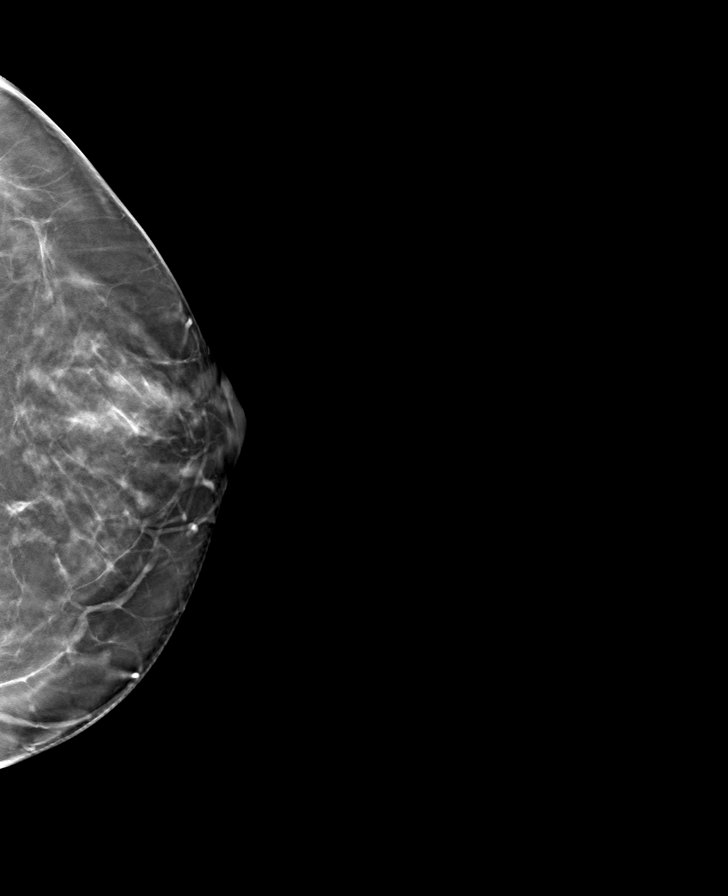

[L MLO tomo · tomo slice 46/91.0]
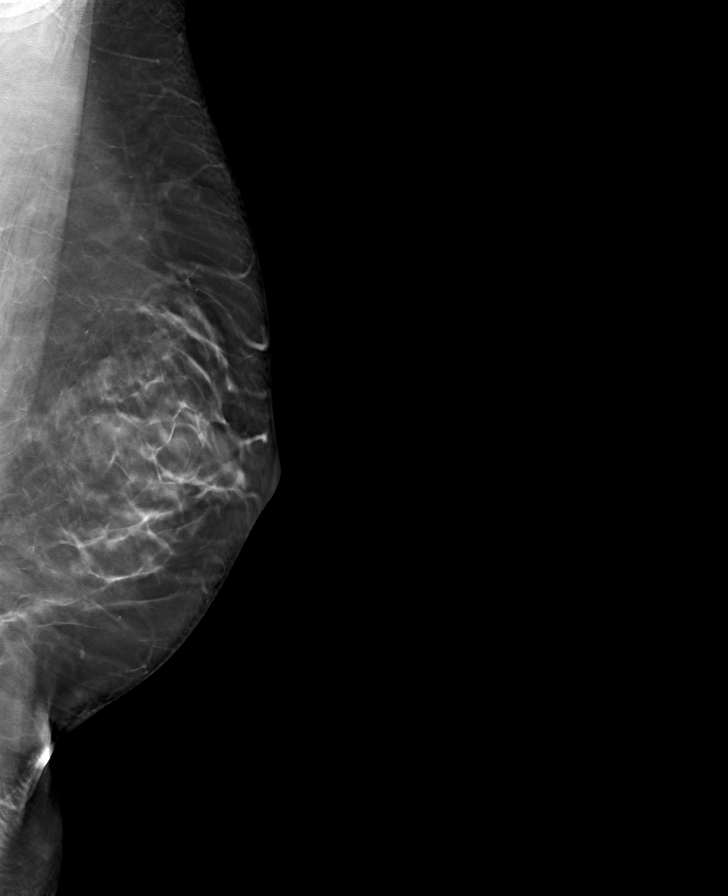

[R CC tomo · tomo slice 41/82.0]
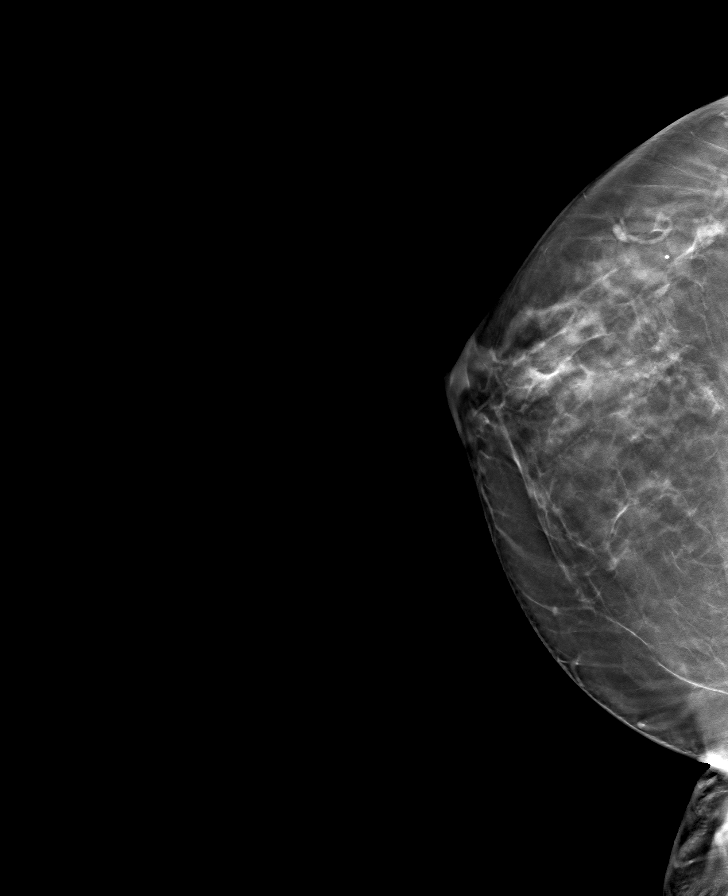

[8 of 24 positions shown; findings below may reference images not displayed]

ACR Breast Density Category b: There are scattered areas of
fibroglandular density.
FINDINGS: There are no findings suspicious for malignancy.
IMPRESSION: No mammographic evidence of malignancy. A result letter of this
screening mammogram will be mailed directly to the patient.

RECOMMENDATION:
Screening mammogram in one year. (Code:51-O-LD2)

BI-RADS CATEGORY  1: Negative.

## 2023-12-29 ENCOUNTER — Other Ambulatory Visit: Payer: Self-pay | Admitting: Obstetrics and Gynecology

## 2023-12-29 DIAGNOSIS — N951 Menopausal and female climacteric states: Secondary | ICD-10-CM

## 2023-12-29 DIAGNOSIS — F419 Anxiety disorder, unspecified: Secondary | ICD-10-CM

## 2023-12-29 MED ORDER — VENLAFAXINE HCL ER 150 MG PO CP24: 150.0000 mg | ORAL_CAPSULE | Freq: Every day | ORAL | 0 refills | Status: DC

## 2023-12-29 MED ORDER — VENLAFAXINE HCL ER 75 MG PO CP24: 75.0000 mg | ORAL_CAPSULE | Freq: Every day | ORAL | 0 refills | Status: DC

## 2023-12-29 NOTE — Telephone Encounter (Signed)
 GAD=14, PHQ=13.

## 2023-12-29 NOTE — Progress Notes (Signed)
 Rx RF effexor. Increase to 225 mg dose

## 2023-12-30 ENCOUNTER — Other Ambulatory Visit: Payer: BC Managed Care – PPO

## 2023-12-30 DIAGNOSIS — Z113 Encounter for screening for infections with a predominantly sexual mode of transmission: Secondary | ICD-10-CM

## 2023-12-30 DIAGNOSIS — Z1321 Encounter for screening for nutritional disorder: Secondary | ICD-10-CM

## 2023-12-30 DIAGNOSIS — R7989 Other specified abnormal findings of blood chemistry: Secondary | ICD-10-CM

## 2023-12-30 DIAGNOSIS — R748 Abnormal levels of other serum enzymes: Secondary | ICD-10-CM

## 2023-12-31 LAB — COMPREHENSIVE METABOLIC PANEL
ALT: 169 [IU]/L — ABNORMAL HIGH (ref 0–32)
AST: 114 [IU]/L — ABNORMAL HIGH (ref 0–40)
Albumin: 4.6 g/dL (ref 3.9–4.9)
Alkaline Phosphatase: 188 [IU]/L — ABNORMAL HIGH (ref 44–121)
BUN/Creatinine Ratio: 14 (ref 9–23)
BUN: 12 mg/dL (ref 6–24)
Bilirubin Total: 0.4 mg/dL (ref 0.0–1.2)
CO2: 24 mmol/L (ref 20–29)
Calcium: 9.4 mg/dL (ref 8.7–10.2)
Chloride: 105 mmol/L (ref 96–106)
Creatinine, Ser: 0.84 mg/dL (ref 0.57–1.00)
Globulin, Total: 2.6 g/dL (ref 1.5–4.5)
Glucose: 84 mg/dL (ref 70–99)
Potassium: 3.8 mmol/L (ref 3.5–5.2)
Sodium: 144 mmol/L (ref 134–144)
Total Protein: 7.2 g/dL (ref 6.0–8.5)
eGFR: 88 mL/min/{1.73_m2} (ref >59)

## 2023-12-31 LAB — HEPATITIS C ANTIBODY: Hep C Virus Ab: NONREACTIVE

## 2023-12-31 LAB — VITAMIN D 25 HYDROXY (VIT D DEFICIENCY, FRACTURES): Vit D, 25-Hydroxy: 29.7 ng/mL — ABNORMAL LOW (ref 30.0–100.0)

## 2023-12-31 LAB — HIV ANTIBODY (ROUTINE TESTING W REFLEX): HIV Screen 4th Generation wRfx: NONREACTIVE

## 2024-01-02 ENCOUNTER — Other Ambulatory Visit: Payer: Self-pay | Admitting: Obstetrics and Gynecology

## 2024-01-02 NOTE — Telephone Encounter (Signed)
 Spoke with pt about worsening LFTs. Is not taking NSAIDs/tylenol ; only took celebrex for 10 days after shoulder surgery a few months ago; drinks a couple alcoholic drinks weekly. Started effexor  after first abn LFT lab 9/24, question worsening related to effexor  vs other. Had prednisone injection in spine before recent labs but not taking orally. Med list updated.  Check more labs 01/06/24; no alcohol use till then. If still abn, will check liver u/s. Question NAFLD vs other.

## 2024-01-09 NOTE — Addendum Note (Signed)
 Addended by: Althea Grimmer B on: 01/09/2024 04:54 PM   Modules accepted: Orders

## 2024-01-10 ENCOUNTER — Other Ambulatory Visit: Payer: BC Managed Care – PPO

## 2024-01-10 DIAGNOSIS — R7989 Other specified abnormal findings of blood chemistry: Secondary | ICD-10-CM

## 2024-01-11 LAB — HEPATIC FUNCTION PANEL
ALT: 43 [IU]/L — ABNORMAL HIGH (ref 0–32)
AST: 21 [IU]/L (ref 0–40)
Albumin: 4.4 g/dL (ref 3.9–4.9)
Alkaline Phosphatase: 133 [IU]/L — ABNORMAL HIGH (ref 44–121)
Bilirubin Total: 0.8 mg/dL (ref 0.0–1.2)
Bilirubin, Direct: 0.24 mg/dL (ref 0.00–0.40)
Total Protein: 7.2 g/dL (ref 6.0–8.5)

## 2024-01-11 LAB — IRON,TIBC AND FERRITIN PANEL
Ferritin: 172 ng/mL — ABNORMAL HIGH (ref 15–150)
Iron Saturation: 29 % (ref 15–55)
Iron: 98 ug/dL (ref 27–159)
Total Iron Binding Capacity: 338 ug/dL (ref 250–450)
UIBC: 240 ug/dL (ref 131–425)

## 2024-01-11 LAB — GAMMA GT: GGT: 95 [IU]/L — ABNORMAL HIGH (ref 0–60)

## 2024-01-11 LAB — HEPATITIS B SURFACE ANTIGEN: Hepatitis B Surface Ag: NEGATIVE

## 2024-01-11 LAB — LACTATE DEHYDROGENASE: LDH: 157 [IU]/L (ref 119–226)

## 2024-01-12 ENCOUNTER — Encounter: Payer: Self-pay | Admitting: Obstetrics and Gynecology

## 2024-01-12 NOTE — Addendum Note (Signed)
Addended by: Althea Grimmer B on: 01/12/2024 11:00 AM   Modules accepted: Orders

## 2024-01-13 ENCOUNTER — Other Ambulatory Visit: Payer: BC Managed Care – PPO

## 2024-01-27 ENCOUNTER — Encounter: Payer: Self-pay | Admitting: Nurse Practitioner

## 2024-01-27 ENCOUNTER — Other Ambulatory Visit: Payer: Self-pay | Admitting: Nurse Practitioner

## 2024-01-27 DIAGNOSIS — R748 Abnormal levels of other serum enzymes: Secondary | ICD-10-CM

## 2024-02-02 ENCOUNTER — Ambulatory Visit
Admission: RE | Admit: 2024-02-02 | Discharge: 2024-02-02 | Disposition: A | Discharge status: Home / Self Care | Payer: BC Managed Care – PPO | Source: Ambulatory Visit | Attending: Nurse Practitioner | Admitting: Nurse Practitioner

## 2024-02-02 DIAGNOSIS — R748 Abnormal levels of other serum enzymes: Secondary | ICD-10-CM | POA: Diagnosis present

## 2024-02-06 ENCOUNTER — Ambulatory Visit: Payer: BC Managed Care – PPO | Admitting: Podiatry

## 2024-02-15 ENCOUNTER — Ambulatory Visit: Payer: BC Managed Care – PPO | Admitting: Podiatry

## 2024-02-15 ENCOUNTER — Encounter: Payer: Self-pay | Admitting: Podiatry

## 2024-02-15 DIAGNOSIS — L600 Ingrowing nail: Secondary | ICD-10-CM

## 2024-02-15 MED ORDER — NEOMYCIN-POLYMYXIN-HC 3.5-10000-1 OT SOLN: OTIC | 0 refills | Status: DC

## 2024-02-15 NOTE — Progress Notes (Signed)
 She presents today chief complaint of ingrown toenail second toe and third toe of her left foot.  She says been like this for the past 6 weeks or so.  She points to the lateral side of the second digit left foot and the medial side of the nail margin third digit left foot.  She is thought that initially they were getting better soaking them in Epsom salt but obviously they were not.  Objective: Vital signs are stable alert oriented x 3 there is mild erythema no cellulitis drainage or odor sharply incurvated lateral nail fold second digit left medial nail fold third digit left.  No purulence no malodor.  Assessment: Ingrown toenail second and third toe left foot  Plan: Chemical matricectomy's were performed today after 3 cc of 50-50 mixture Marcaine plain and lidocaine plain were divided between the second and third toes of the left foot.  Chemical matricectomy to the aforementioned borders was performed and tolerated well.  She was given both oral written home-going instruction for the care and soaking of the toe as well as a prescription for Cortisporin Otic to be applied twice daily after soaking.  Follow-up with her in 2 to 3 weeks.

## 2024-02-15 NOTE — Patient Instructions (Signed)

## 2024-02-16 NOTE — Addendum Note (Signed)
 Addended by: Kristian Covey on: 02/16/2024 10:35 AM   Modules accepted: Level of Service

## 2024-02-27 ENCOUNTER — Ambulatory Visit: Payer: BC Managed Care – PPO | Admitting: Nurse Practitioner

## 2024-03-05 ENCOUNTER — Ambulatory Visit: Payer: BC Managed Care – PPO | Admitting: Podiatry

## 2024-03-07 ENCOUNTER — Encounter: Payer: Self-pay | Admitting: Nurse Practitioner

## 2024-03-07 ENCOUNTER — Telehealth: Admitting: Nurse Practitioner

## 2024-03-07 VITALS — Wt 216.0 lb

## 2024-03-07 DIAGNOSIS — E66811 Obesity, class 1: Secondary | ICD-10-CM

## 2024-03-07 DIAGNOSIS — N951 Menopausal and female climacteric states: Secondary | ICD-10-CM

## 2024-03-07 DIAGNOSIS — Z131 Encounter for screening for diabetes mellitus: Secondary | ICD-10-CM

## 2024-03-07 DIAGNOSIS — R635 Abnormal weight gain: Secondary | ICD-10-CM | POA: Diagnosis not present

## 2024-03-07 DIAGNOSIS — E6609 Other obesity due to excess calories: Secondary | ICD-10-CM

## 2024-03-07 DIAGNOSIS — Z130 Encounter for screening for diseases of the blood and blood-forming organs and certain disorders involving the immune mechanism: Secondary | ICD-10-CM

## 2024-03-07 MED ORDER — TIRZEPATIDE-WEIGHT MANAGEMENT 2.5 MG/0.5ML ~~LOC~~ SOAJ: 2.5000 mg | SUBCUTANEOUS | 0 refills | Status: DC

## 2024-03-07 MED ORDER — CLONIDINE 0.1 MG/24HR TD PTWK: 0.1000 mg | MEDICATED_PATCH | TRANSDERMAL | 0 refills | Status: DC

## 2024-03-07 NOTE — Progress Notes (Signed)
 Name: Regina Anderson   MRN: [id number]    DOB: May 02, 1981   Date:03/07/2024       Progress Note  Subjective  Chief Complaint  Chief Complaint  Patient presents with   Obesity    Hormonal changes weight gain, skin dryness, vision changes, hair loss, night sweats,low sex drive.  Discuss possible weight loss meds    I connected with  Regina Anderson  on 03/07/24 at  2:00 PM EDT by a video enabled telemedicine application and verified that I am speaking with the correct person using two identifiers.  I discussed the limitations of evaluation and management by telemedicine and the availability of in person appointments. The patient expressed understanding and agreed to proceed with the virtual visit  Staff also discussed with the patient that there may be a patient responsible charge related to this service. Patient Location: home Provider Location: [facility] Additional Individuals present: alone  HPI  Discussed the use of AI scribe software for clinical note transcription with the patient, who gave verbal consent to proceed.  History of Present Illness   The patient presents with perimenopausal symptoms and weight gain.  She is experiencing significant night sweats, hair thinning, and dry skin, consistent with perimenopause. She wakes up drenched in sweat, necessitating keeping her home at 66 degrees and using a fan at night. Her hair has become very thin, and she is concerned about the amount of hair loss during washing. Additionally, her skin is drier than usual, and she feels like she cannot apply enough lotion. Her menstrual cycles remain regular but have become heavier, lasting about four days. Hormone levels have always been normal.  She has gained weight despite dietary efforts, currently weighing 216.4 pounds. Her diet includes 100 grams of protein and 30 grams of healthy fats, with mostly water intake and limited soda or tea to one glass a week. Despite these efforts, she has gained 14  pounds while her partner has lost 24 pounds on the same regimen. She has previously tried phentermine without success.  She experienced severe withdrawal symptoms after discontinuing Effexor, including 'brain zaps' that felt like a 'bug zapper between my ears.' These symptoms resolved after 18 days, and she is adamant about not using similar medications in the future.  She has a history of shoulder surgeries, the first about a year ago and the second in November, following a separated [facility] joint that did not heal naturally. She was active in the gym, particularly enjoying boxing and circuit training, until these injuries.   Patient Active Problem List   Diagnosis Date Noted   Family history of breast cancer 04/14/2023   Vitamin B 12 deficiency 09/07/2022   Abnormal TSH 09/07/2022   Class 1 obesity due to excess calories with serious comorbidity and body mass index (BMI) of 30.0 to 30.9 in adult 09/07/2022   History of cervical dysplasia 11/10/2021   Screening for lipid disorders 06/04/2020   Vitamin D deficiency 06/04/2020   Fatigue 06/04/2020   Body mass index 29.0-29.9, adult 06/04/2020   Abnormal skin growth- left upper thigh  06/04/2020   Intractable migraine with aura without status migrainosus 01/29/2016   Family history of diverticulitis of colon 07/25/2015    Past Surgical History:  Procedure Laterality Date   CESAREAN SECTION N/A 2008   CHOLECYSTECTOMY N/A 05/16/2015   Procedure: LAPAROSCOPIC CHOLECYSTECTOMY WITH INTRAOPERATIVE CHOLANGIOGRAM;  Surgeon: Nadeen Landau, MD;  Location: [facility] ORS;  Service: General;  Laterality: N/A;   DILATION AND CURETTAGE  OF UTERUS N/A 2007   miossed ab   FRACTURE SURGERY Left 2004   left index finger 3 screws   LEEP  2006   SHOULDER ARTHROCENTESIS      Family History  Problem Relation Age of Onset   Hyperlipidemia Mother    Cancer Father        lung   Heart disease Father    Heart attack Father    Hyperlipidemia Father    Diabetes  Paternal Uncle    Hypertension Maternal Grandmother    Breast cancer Maternal Grandmother 12   Colon cancer Maternal Grandmother 28   Lung cancer Maternal Grandmother    Cancer Maternal Grandmother    Hypertension Maternal Grandfather    Diabetes Paternal Grandmother    Heart disease Paternal Grandmother    Hypertension Paternal Grandmother    Diabetes Paternal Grandfather    Hypertension Paternal Grandfather    Breast cancer Other    Breast cancer Other     Social History   Socioeconomic History   Marital status: Married    Spouse name: Not on file   Number of children: Not on file   Years of education: Not on file   Highest education level: GED or equivalent  Occupational History   Not on file  Tobacco Use   Smoking status: Never   Smokeless tobacco: Never  Vaping Use   Vaping status: Never Used  Substance and Sexual Activity   Alcohol use: Yes    Alcohol/week: 6.0 standard drinks of alcohol    Types: 6 Cans of beer per week    Comment: socially   Drug use: No   Sexual activity: Yes    Birth control/protection: Surgical    Comment: bilateral tubal ligation  Other Topics Concern   Not on file  Social History Narrative   Not on file   Social Drivers of Health   Financial Resource Strain: Low Risk  (12/27/2023)   Received from [facility]   Overall Financial Resource Strain (CARDIA)    Difficulty of Paying Living Expenses: Not hard at all  Food Insecurity: No Food Insecurity (12/27/2023)   Received from [facility]   Hunger Vital Sign    Worried About Running Out of Food in the Last Year: Never true    Ran Out of Food in the Last Year: Never true  Transportation Needs: No Transportation Needs (12/27/2023)   Received from [facility] - Transportation    In the past 12 months, has lack of transportation kept you from medical appointments or from getting medications?: No    Lack of Transportation  (Non-Medical): No  Physical Activity: Inactive (10/10/2023)   Exercise Vital Sign    Days of Exercise per Week: 0 days    Minutes of Exercise per Session: 0 min  Stress: No Stress Concern Present (10/10/2023)   [employer] of Occupational [employer] - Occupational Stress Questionnaire    Feeling of Stress : Only a little  Recent Concern: Stress - Stress Concern Present (10/09/2023)   [employer] of Occupational Health - Occupational Stress Questionnaire    Feeling of Stress : To some extent  Social Connections: Moderately Integrated (10/10/2023)   Social Connection and Isolation Panel [NHANES]    Frequency of Communication with Friends and Family: More than three times a week    Frequency of Social Gatherings with Friends and Family: More than three times a week    Attends Religious Services: 1  to 4 times per year    Active Member of Clubs or Organizations: No    Attends Banker Meetings: Never    Marital Status: Married  Catering manager Violence: Not At Risk (10/10/2023)   Humiliation, Afraid, Rape, and Kick questionnaire    Fear of Current or Ex-Partner: No    Emotionally Abused: No    Physically Abused: No    Sexually Abused: No     Current Outpatient Medications:    cloNIDine (CATAPRES - DOSED IN MG/24 HR) 0.1 mg/24hr patch, Place 1 patch (0.1 mg total) onto the skin once a week., Disp: 4 patch, Rfl: 0   tirzepatide (ZEPBOUND) 2.5 MG/0.5ML Pen, Inject 2.5 mg into the skin once a week., Disp: 2 mL, Rfl: 0   cyanocobalamin (VITAMIN B12) 1000 MCG/ML injection, ADMINISTER 1 ML(1000 MCG) IN THE MUSCLE EVERY 30 DAYS, Disp: 3 mL, Rfl: 11   gabapentin (NEURONTIN) 100 MG capsule, Take 1 capsule (100 mg total) by mouth 3 (three) times daily., Disp: 90 capsule, Rfl: 3   neomycin-polymyxin-hydrocortisone (CORTISPORIN) OTIC solution, Apply one to two drops to toe after soaking twice daily., Disp: 10 mL, Rfl: 0   rizatriptan (MAXALT) 10 MG tablet, Take by mouth., Disp: ,  Rfl:    tiZANidine (ZANAFLEX) 4 MG tablet, Take 0.5-1.5 tablets (2-6 mg total) by mouth every 8 (eight) hours as needed for muscle spasms (muscle tightness)., Disp: 90 tablet, Rfl: 2  Allergies  Allergen Reactions   Macadamia Nut Oil Anaphylaxis   Other     Other reaction(s): Unknown Uncoded Allergy. Allergen: MACADAMI NUTS    I personally reviewed active problem list, medication list, allergies, notes from last encounter with the patient/caregiver today.   ROS Ten systems reviewed and is negative except as mentioned in HPI   Objective  Virtual encounter, vitals not obtained.  Body mass index is 31.9 kg/m.  Physical Exam Awake, alert and oriented, speaking in complete sentences   No results found for this or any previous visit (from the past 72 hours).  PHQ2/9:    03/07/2024    1:56 PM 11/30/2023    8:58 AM 10/10/2023    2:45 PM 10/10/2023    2:41 PM 10/05/2022    1:02 PM  Depression screen PHQ 2/9  Decreased Interest 0 0 0 0 0  Down, Depressed, Hopeless 0 0 0 0 0  PHQ - 2 Score 0 0 0 0 0  Altered sleeping 0 0 0  0  Tired, decreased energy 0 0 0  0  Change in appetite 0 0 0  0  Feeling bad or failure about yourself  0 0 0  0  Trouble concentrating 0 1 1  0  Moving slowly or fidgety/restless 0 0 0  0  Suicidal thoughts 0 0 0  0  PHQ-9 Score 0 1 1  0  Difficult doing work/chores Not difficult at all Not difficult at all   Not difficult at all   PHQ-2/9 Result is negative.    Fall Risk:    11/30/2023    8:58 AM 10/10/2023    2:41 PM 10/05/2022    1:02 PM 09/07/2022    1:06 PM 05/14/2021    8:38 AM  Fall Risk   Falls in the past year? 0 0 0 0 0  Number falls in past yr:  0 0 0 0  Injury with Fall?  0 0 0 0  Risk for fall due to : No Fall Risks No Fall Risks  Follow up Falls prevention discussed Falls prevention discussed        Assessment & Plan  Assessment and Plan    Perimenopausal symptoms   Experiencing symptoms consistent with perimenopause,  including night sweats, weight gain, hair thinning, and dry skin. Periods remain regular but heavier. Hormone replacement therapy (HRT) was discussed, but due to regular cycles and potential risks, it was not recommended. Risks of HRT include the need to override natural hormone production, which may not provide noticeable benefits. Clonidine patches were suggested to help with vasomotor symptoms, as she has minimal side effects, primarily lowering blood pressure.   - Order blood work to check hormone levels   - Prescribe clonidine patches for vasomotor symptoms    Weight gain   Reports significant weight gain despite dietary efforts and regular exercise. Previous use of phentermine was ineffective. Discussed GLP-1 receptor agonists (e.g., Ozempic, Mounjaro, Wegovy, Zepbound) and Contrave as potential weight loss options. GLP-1 receptor agonists are injections with side effects including abdominal pain, nausea, and fatigue, and are expensive if not covered by insurance. Contrave is a combination of naltrexone and Wellbutrin, with side effects including headaches and increased thirst. Open to trying injections, pending insurance approval. No family history of thyroid cancer or personal history of pancreatitis, which are contraindications for GLP-1 receptor agonists.   - Submit prior authorization for Zepbound   - Consider Contrave if GLP-1 receptor agonists are not covered by insurance    Follow-up   Plans to have blood work done on Friday after her eye appointment. Aware of the need to fast before the test.   - Instruct her to have fasting blood work done at her convenience   - Arrange for blood work to be done without an appointment         I discussed the assessment and treatment plan with the patient. The patient was provided an opportunity to ask questions and all were answered. The patient agreed with the plan and demonstrated an understanding of the instructions.  The patient was advised to  call back or seek an in-person evaluation if the symptoms worsen or if the condition fails to improve as anticipated.  I provided 15 minutes of non-face-to-face time during this encounter.

## 2024-03-08 ENCOUNTER — Telehealth: Payer: Self-pay

## 2024-03-08 NOTE — Telephone Encounter (Signed)
 [facility] Prior Auth for   tirzepatide (ZEPBOUND) 2.5 MG/0.5ML Pen

## 2024-03-08 NOTE — Telephone Encounter (Signed)
 PA submitted through online form according to insurance

## 2024-03-14 ENCOUNTER — Encounter: Payer: Self-pay | Admitting: Nurse Practitioner

## 2024-03-20 ENCOUNTER — Encounter: Payer: Self-pay | Admitting: Nurse Practitioner

## 2024-03-21 LAB — COMPREHENSIVE METABOLIC PANEL
AG Ratio: 1.4 (calc) (ref 1.0–2.5)
ALT: 27 U/L (ref 6–29)
AST: 22 U/L (ref 10–30)
Albumin: 4 g/dL (ref 3.6–5.1)
Alkaline phosphatase (APISO): 84 U/L (ref 31–125)
BUN: 10 mg/dL (ref 7–25)
CO2: 28 mmol/L (ref 20–32)
Calcium: 9.1 mg/dL (ref 8.6–10.2)
Chloride: 107 mmol/L (ref 98–110)
Creat: 0.73 mg/dL (ref 0.50–0.99)
Globulin: 2.8 g/dL (ref 1.9–3.7)
Glucose, Bld: 80 mg/dL (ref 65–99)
Potassium: 4.2 mmol/L (ref 3.5–5.3)
Sodium: 142 mmol/L (ref 135–146)
Total Bilirubin: 0.5 mg/dL (ref 0.2–1.2)
Total Protein: 6.8 g/dL (ref 6.1–8.1)
eGFR: 104 mL/min/{1.73_m2} (ref >=60)

## 2024-03-21 LAB — TSH: TSH: 2.14 m[IU]/L

## 2024-03-21 LAB — CBC WITH DIFFERENTIAL/PLATELET
Absolute Lymphocytes: 1730 {cells}/uL (ref 850–3900)
Absolute Monocytes: 459 {cells}/uL (ref 200–950)
Basophils Absolute: 31 {cells}/uL (ref 0–200)
Basophils Relative: 0.5 %
Eosinophils Absolute: 62 {cells}/uL (ref 15–500)
Eosinophils Relative: 1 %
HCT: 42 % (ref 35.0–45.0)
Hemoglobin: 14.1 g/dL (ref 11.7–15.5)
MCH: 31.9 pg (ref 27.0–33.0)
MCHC: 33.6 g/dL (ref 32.0–36.0)
MCV: 95 fL (ref 80.0–100.0)
MPV: 11.3 fL (ref 7.5–12.5)
Monocytes Relative: 7.4 %
Neutro Abs: 3918 {cells}/uL (ref 1500–7800)
Neutrophils Relative %: 63.2 %
Platelets: 221 10*3/uL (ref 140–400)
RBC: 4.42 10*6/uL (ref 3.80–5.10)
RDW: 12.2 % (ref 11.0–15.0)
Total Lymphocyte: 27.9 %
WBC: 6.2 10*3/uL (ref 3.8–10.8)

## 2024-03-21 LAB — FSH/LH
FSH: 9.4 m[IU]/mL
LH: 10.5 m[IU]/mL

## 2024-03-21 LAB — HEMOGLOBIN A1C
Hgb A1c MFr Bld: 5 %{Hb} (ref <5.7)
Mean Plasma Glucose: 97 mg/dL
eAG (mmol/L): 5.4 mmol/L

## 2024-03-21 LAB — ESTROGENS, TOTAL: Estrogen: 180 pg/mL

## 2024-03-21 LAB — PROLACTIN: Prolactin: 12.9 ng/mL

## 2024-04-03 ENCOUNTER — Other Ambulatory Visit: Payer: Self-pay | Admitting: Nurse Practitioner

## 2024-04-03 DIAGNOSIS — N951 Menopausal and female climacteric states: Secondary | ICD-10-CM

## 2024-04-04 NOTE — Telephone Encounter (Signed)
 Requested Prescriptions  Pending Prescriptions Disp Refills   cloNIDine (CATAPRES - DOSED IN MG/24 HR) 0.1 mg/24hr patch [Pharmacy Med Name: CLONIDINE 0.1MG /24H WEEKLY PATCH] 4 patch 0    Sig: APPLY 1 PATCH(0.1 MG) TOPICALLY TO THE SKIN 1 TIME A WEEK     Cardiovascular:  Alpha-2 Agonists Passed - 04/04/2024 12:42 PM      Passed - Last BP in normal range    BP Readings from Last 1 Encounters:  11/30/23 114/86         Passed - Last Heart Rate in normal range    Pulse Readings from Last 1 Encounters:  11/30/23 84         Passed - Valid encounter within last 6 months    Recent Outpatient Visits           4 weeks ago Class 1 obesity due to excess calories with serious comorbidity and body mass index (BMI) of 30.0 to 30.9 in adult   [facility] Berniece Salines, FNP       Future Appointments             In 6 months Zane Herald, Rudolpho Sevin, FNP [facility]

## 2024-10-11 ENCOUNTER — Ambulatory Visit (INDEPENDENT_AMBULATORY_CARE_PROVIDER_SITE_OTHER): Payer: BC Managed Care – PPO | Admitting: Nurse Practitioner

## 2024-10-11 ENCOUNTER — Encounter: Payer: Self-pay | Admitting: Nurse Practitioner

## 2024-10-11 VITALS — BP 118/76 | HR 70 | Resp 16 | Ht 69.0 in | Wt 217.0 lb

## 2024-10-11 DIAGNOSIS — Z130 Encounter for screening for diseases of the blood and blood-forming organs and certain disorders involving the immune mechanism: Secondary | ICD-10-CM | POA: Diagnosis not present

## 2024-10-11 DIAGNOSIS — Z1231 Encounter for screening mammogram for malignant neoplasm of breast: Secondary | ICD-10-CM

## 2024-10-11 DIAGNOSIS — Z13220 Encounter for screening for lipoid disorders: Secondary | ICD-10-CM | POA: Diagnosis not present

## 2024-10-11 DIAGNOSIS — Z0000 Encounter for general adult medical examination without abnormal findings: Secondary | ICD-10-CM

## 2024-10-11 DIAGNOSIS — Z131 Encounter for screening for diabetes mellitus: Secondary | ICD-10-CM

## 2024-10-11 NOTE — Progress Notes (Signed)
 Name: Regina Anderson   MRN: [id number]    DOB: [birth year]   Date:10/11/2024       Progress Note  Subjective  Chief Complaint  Chief Complaint  Patient presents with   Annual Exam    HPI  Patient presents for annual CPE. Discussed the use of AI scribe software for clinical note transcription with the patient, who gave verbal consent to proceed.  History of Present Illness Regina Anderson is a 43 year old female who presents for an annual physical exam and is due for a mammogram.  Postoperative status and musculoskeletal symptoms - Status post back surgery seven weeks ago with placement of two rods and four screws - Previously underwent multiple injections and MRI prior to surgery - Cleared to resume some physical activities, including treadmill, elliptical, and stair stepper as of last Friday - Not currently running due to recent back surgery - Running routine impacted by the passing of her running partner  Sleep disturbance - Poor sleep averaging about four hours per night, particularly since back surgery - Vivid nightmare related to surgery - Anxiety regarding recovery from surgery  Urinary incontinence - Occasional urinary incontinence, specifically when on a trampoline - No recent episodes of incontinence  Menstrual history - Last menstrual period was on Monday - Continues to have regular menstrual cycles  Preventive health maintenance - Due for annual mammogram - No recent influenza vaccination - No fear of needles  Dietary habits - Generally consumes a balanced diet - Diet could include more green vegetables  Safety and social environment - No violence at home except for playful interactions with her cat    Diet: well balanced diet Exercise: just released to do physical activity after surgery  Sleep: 4 hours Last dental exam:every 6 months Last eye exam: during the summer  Flowsheet Row Office Visit from 10/11/2024 in [facility]   AUDIT-C Score 1   Depression: Phq 9 is  negative    10/11/2024    9:01 AM 03/07/2024    1:56 PM 11/30/2023    8:58 AM 10/10/2023    2:45 PM 10/10/2023    2:41 PM  Depression screen PHQ 2/9  Decreased Interest 0 0 0 0 0  Down, Depressed, Hopeless 0 0 0 0 0  PHQ - 2 Score 0 0 0 0 0  Altered sleeping  0 0 0   Tired, decreased energy  0 0 0   Change in appetite  0 0 0   Feeling bad or failure about yourself   0 0 0   Trouble concentrating  0 1 1   Moving slowly or fidgety/restless  0 0 0   Suicidal thoughts  0 0 0   PHQ-9 Score  0 1 1   Difficult doing work/chores  Not difficult at all Not difficult at all     Hypertension: BP Readings from Last 3 Encounters:  10/11/24 118/76  11/30/23 114/86  10/10/23 110/72   Obesity: Wt Readings from Last 3 Encounters:  10/11/24 217 lb (98.4 kg)  03/07/24 216 lb (98 kg)  11/30/23 202 lb 6.4 oz (91.8 kg)   BMI Readings from Last 3 Encounters:  10/11/24 32.05 kg/m  03/07/24 31.90 kg/m  11/30/23 29.89 kg/m     Vaccines:  HPV: up to at age 60 , ask insurance if age between 62-45  Shingrix: 58-64 yo and ask insurance if covered when patient above 90 yo Pneumonia:  educated and discussed with patient. Flu:  educated and discussed with patient.  Hep C Screening: completed STD testing and prevention (HIV/chl/gon/syphilis): completed Intimate partner violence:none Sexual History : yes, tubal ligation Menstrual History/LMP/Abnormal Bleeding: [facility]: Monday Incontinence Symptoms: only on a trampoline   Breast cancer:  - Last Mammogram: 05/11/2023, order placed - BRCA gene screening: none  Osteoporosis: Discussed high calcium and vitamin D  supplementation, weight bearing exercises  Cervical cancer screening: 11/10/2021  Skin cancer: Discussed monitoring for atypical lesions  Colorectal cancer: does not qualify   Lung cancer:   Low Dose CT Chest recommended if Age 44-80 years, 20 pack-year currently smoking OR have quit w/in 15years.  Patient does not qualify.   ECG: 12/25/2020  Advanced Care Planning: A voluntary discussion about advance care planning including the explanation and discussion of advance directives.  Discussed health care proxy and Living will, and the patient was able to identify a health care proxy as husband.  Patient does not have a living will at present time. If patient does have living will, I have requested they bring this to the clinic to be scanned in to their chart.  Lipids: Lab Results  Component Value Date   CHOL 178 09/07/2022   CHOL 192 06/04/2020   CHOL 182 08/18/2018   Lab Results  Component Value Date   HDL 66 09/07/2022   HDL 89 06/04/2020   HDL 84 08/18/2018   Lab Results  Component Value Date   LDLCALC 95 09/07/2022   LDLCALC 93 06/04/2020   LDLCALC 87 08/18/2018   Lab Results  Component Value Date   TRIG 84 09/07/2022   TRIG 53 06/04/2020   TRIG 53 08/18/2018   Lab Results  Component Value Date   CHOLHDL 2.7 09/07/2022   CHOLHDL 2.2 06/04/2020   CHOLHDL 2.2 08/18/2018   No results found for: LDLDIRECT  Glucose: Glucose  Date Value Ref Range Status  12/30/2023 84 70 - 99 mg/dL Final  90/82/7975 81 70 - 99 mg/dL Final  94/80/7977 90 65 - 99 mg/dL Final  95/72/7983 897 (H) mg/dL Final    Comment:    34-00 NOTE: New Reference Range  03/04/15    Glucose, Bld  Date Value Ref Range Status  03/15/2024 80 65 - 99 mg/dL Final    Comment:    .            Fasting reference interval .   09/07/2022 78 65 - 99 mg/dL Final    Comment:    .            Fasting reference interval .     Patient Active Problem List   Diagnosis Date Noted   Family history of breast cancer 04/14/2023   Vitamin B 12 deficiency 09/07/2022   Abnormal TSH 09/07/2022   Class 1 obesity due to excess calories with serious comorbidity and body mass index (BMI) of 30.0 to 30.9 in adult 09/07/2022   History of cervical dysplasia 11/10/2021   Screening for lipid disorders 06/04/2020    Vitamin D  deficiency 06/04/2020   Fatigue 06/04/2020   Body mass index 29.0-29.9, adult 06/04/2020   Abnormal skin growth- left upper thigh  06/04/2020   Intractable migraine with aura without status migrainosus 01/29/2016   Family history of diverticulitis of colon 07/25/2015    Past Surgical History:  Procedure Laterality Date   CESAREAN SECTION N/A 2008   CHOLECYSTECTOMY N/A 05/16/2015   Procedure: LAPAROSCOPIC CHOLECYSTECTOMY WITH INTRAOPERATIVE CHOLANGIOGRAM;  Surgeon: Larinda Unknown Sharps, MD;  Location: [facility] ORS;  Service: General;  Laterality: N/A;   DILATION AND CURETTAGE OF UTERUS N/A 2007   miossed ab   FRACTURE SURGERY Left 2004   left index finger 3 screws   LEEP  2006   SHOULDER ARTHROCENTESIS     SPINAL FUSION     TUBAL LIGATION  10/27/2007    Family History  Problem Relation Age of Onset   Hyperlipidemia Mother    Cancer Father        lung   Heart disease Father    Heart attack Father    Hyperlipidemia Father    Diabetes Paternal Uncle    Hypertension Maternal Grandmother    Breast cancer Maternal Grandmother 77   Colon cancer Maternal Grandmother 71   Lung cancer Maternal Grandmother    Cancer Maternal Grandmother    Hypertension Maternal Grandfather    Diabetes Paternal Grandmother    Heart disease Paternal Grandmother    Hypertension Paternal Grandmother    Diabetes Paternal Grandfather    Hypertension Paternal Grandfather    Breast cancer Other    Breast cancer Other     Social History   Socioeconomic History   Marital status: Married    Spouse name: Not on file   Number of children: Not on file   Years of education: Not on file   Highest education level: GED or equivalent  Occupational History   Not on file  Tobacco Use   Smoking status: Never   Smokeless tobacco: Never  Vaping Use   Vaping status: Never Used  Substance and Sexual Activity   Alcohol use: Yes    Alcohol/week: 6.0 standard drinks of alcohol    Types: 6 Cans of beer  per week    Comment: socially   Drug use: No   Sexual activity: Yes    Birth control/protection: Surgical    Comment: bilateral tubal ligation  Other Topics Concern   Not on file  Social History Narrative   Not on file   Social Drivers of Health   Financial Resource Strain: Low Risk  (10/11/2024)   Overall Financial Resource Strain (CARDIA)    Difficulty of Paying Living Expenses: Not hard at all  Food Insecurity: No Food Insecurity (10/11/2024)   Hunger Vital Sign    Worried About Running Out of Food in the Last Year: Never true    Ran Out of Food in the Last Year: Never true  Transportation Needs: No Transportation Needs (10/11/2024)   PRAPARE - Administrator, Civil Service (Medical): No    Lack of Transportation (Non-Medical): No  Physical Activity: Inactive (10/11/2024)   Exercise Vital Sign    Days of Exercise per Week: 0 days    Minutes of Exercise per Session: 0 min  Stress: No Stress Concern Present (10/11/2024)   [employer] of Occupational Health - Occupational Stress Questionnaire    Feeling of Stress: Only a little  Social Connections: Moderately Integrated (10/11/2024)   Social Connection and Isolation Panel    Frequency of Communication with Friends and Family: More than three times a week    Frequency of Social Gatherings with Friends and Family: More than three times a week    Attends Religious Services: 1 to 4 times per year    Active Member of [employer] or Organizations: No    Attends Banker Meetings: Never    Marital Status: Married  Catering manager Violence: Not At Risk (10/11/2024)   Humiliation, Afraid, Rape, and Kick questionnaire    Fear of Current  or Ex-Partner: No    Emotionally Abused: No    Physically Abused: No    Sexually Abused: No     Current Outpatient Medications:    cyanocobalamin  (VITAMIN B12) 1000 MCG/ML injection, ADMINISTER 1 ML(1000 MCG) IN THE MUSCLE EVERY 30 DAYS, Disp: 3 mL, Rfl: 11   gabapentin   (NEURONTIN ) 100 MG capsule, Take 1 capsule (100 mg total) by mouth 3 (three) times daily., Disp: 90 capsule, Rfl: 3   rizatriptan  (MAXALT ) 10 MG tablet, Take by mouth., Disp: , Rfl:    tiZANidine  (ZANAFLEX ) 4 MG tablet, Take 0.5-1.5 tablets (2-6 mg total) by mouth every 8 (eight) hours as needed for muscle spasms (muscle tightness)., Disp: 90 tablet, Rfl: 2  Allergies  Allergen Reactions   Macadamia Nut Oil Anaphylaxis   Other     Other reaction(s): Unknown Uncoded Allergy. Allergen: MACADAMI NUTS     ROS  Constitutional: Negative for fever or weight change.  Respiratory: Negative for cough and shortness of breath.   Cardiovascular: Negative for chest pain or palpitations.  Gastrointestinal: Negative for abdominal pain, no bowel changes.  Musculoskeletal: Negative for gait problem or joint swelling.  Skin: Negative for rash.  Neurological: Negative for dizziness or headache.  No other specific complaints in a complete review of systems (except as listed in HPI above).   Objective  Vitals:   10/11/24 0905  BP: 118/76  Pulse: 70  Resp: 16  SpO2: 99%  Weight: 217 lb (98.4 kg)  Height: 5' 9 (1.753 m)    Body mass index is 32.05 kg/m.  Physical Exam Vitals reviewed.  Constitutional:      Appearance: Normal appearance.  HENT:     Head: Normocephalic.     Right Ear: Tympanic membrane normal.     Left Ear: Tympanic membrane normal.     Nose: Nose normal.  Eyes:     Extraocular Movements: Extraocular movements intact.     Conjunctiva/sclera: Conjunctivae normal.     Pupils: Pupils are equal, round, and reactive to light.  Neck:     Thyroid : No thyroid  mass, thyromegaly or thyroid  tenderness.  Cardiovascular:     Rate and Rhythm: Normal rate and regular rhythm.     Pulses: Normal pulses.     Heart sounds: Normal heart sounds.  Pulmonary:     Effort: Pulmonary effort is normal.     Breath sounds: Normal breath sounds.  Abdominal:     General: Bowel sounds are  normal.     Palpations: Abdomen is soft.  Musculoskeletal:        General: Normal range of motion.     Cervical back: Normal range of motion and neck supple.     Right lower leg: No edema.     Left lower leg: No edema.  Skin:    General: Skin is warm and dry.     Capillary Refill: Capillary refill takes less than 2 seconds.  Neurological:     General: No focal deficit present.     Mental Status: She is alert and oriented to person, place, and time. Mental status is at baseline.  Psychiatric:        Mood and Affect: Mood normal.        Behavior: Behavior normal.        Thought Content: Thought content normal.        Judgment: Judgment normal.     Fall Risk:    10/11/2024    9:01 AM 11/30/2023    8:58 AM 10/10/2023  2:41 PM 10/05/2022    1:02 PM 09/07/2022    1:06 PM  Fall Risk   Falls in the past year? 0 0 0 0 0  Number falls in past yr: 0  0 0 0  Injury with Fall? 0  0 0 0  Risk for fall due to : No Fall Risks No Fall Risks No Fall Risks    Follow up Falls prevention discussed Falls prevention discussed Falls prevention discussed       Functional Status Survey: Is the patient deaf or have difficulty hearing?: No Does the patient have difficulty seeing, even when wearing glasses/contacts?: No Does the patient have difficulty concentrating, remembering, or making decisions?: No Does the patient have difficulty walking or climbing stairs?: No Does the patient have difficulty dressing or bathing?: No Does the patient have difficulty doing errands alone such as visiting a doctor's office or shopping?: No   Assessment & Plan  Problem List Items Addressed This Visit   None Visit Diagnoses       Annual physical exam    -  Primary     Encounter for screening mammogram for malignant neoplasm of breast         Screening for deficiency anemia         Screening for cholesterol level         Screening for diabetes mellitus          Assessment and Plan Assessment &  Plan Adult Wellness Visit Routine adult wellness visit with blood pressure at 118/76 mmHg. Sleep is suboptimal. Continues to have menstrual periods, last on Monday. No significant issues with incontinence except on trampoline. Dental exam scheduled for November 5th. Last eye exam was over the summer before surgery. - Continue balanced diet with increased greens - Resume gym activities as released  Status post lumbar spinal fusion surgery Status post lumbar spinal fusion surgery 7 weeks ago with placement of two rods and four screws. Released from brace with restrictions on bending, lifting, and twisting. No significant pain reported. Discussed potential issues with airport security due to titanium implants. - Continue to avoid bending, lifting, and twisting  Screening mammogram for malignant neoplasm of breast Due for screening mammogram. Last mammogram was in May, overdue for current screening. - Order screening mammogram  Screening for lipoid disorders, diabetes mellitus, and hematologic disorders Routine screening for lipoid disorders, diabetes mellitus, and hematologic disorders as part of annual wellness visit. - Order routine lab work for lipoid disorders, diabetes mellitus, and hematologic disorders    -USPSTF grade A and B recommendations reviewed with patient; age-appropriate recommendations, preventive care, screening tests, etc discussed and encouraged; healthy living encouraged; see AVS for patient education given to patient -Discussed importance of 150 minutes of physical activity weekly, eat two servings of fish weekly, eat one serving of tree nuts ( cashews, pistachios, pecans, almonds.SABRA) every other day, eat 6 servings of fruit/vegetables daily and drink plenty of water and avoid sweet beverages.   -Reviewed Health Maintenance: yes

## 2024-10-12 ENCOUNTER — Ambulatory Visit: Payer: Self-pay | Admitting: Nurse Practitioner

## 2024-10-12 LAB — COMPREHENSIVE METABOLIC PANEL WITH GFR
AG Ratio: 1.7 (calc) (ref 1.0–2.5)
ALT: 40 U/L — ABNORMAL HIGH (ref 6–29)
AST: 32 U/L — ABNORMAL HIGH (ref 10–30)
Albumin: 4.6 g/dL (ref 3.6–5.1)
Alkaline phosphatase (APISO): 112 U/L (ref 31–125)
BUN: 12 mg/dL (ref 7–25)
CO2: 24 mmol/L (ref 20–32)
Calcium: 9.1 mg/dL (ref 8.6–10.2)
Chloride: 105 mmol/L (ref 98–110)
Creat: 0.71 mg/dL (ref 0.50–0.99)
Globulin: 2.7 g/dL (ref 1.9–3.7)
Glucose, Bld: 87 mg/dL (ref 65–99)
Potassium: 4.3 mmol/L (ref 3.5–5.3)
Sodium: 139 mmol/L (ref 135–146)
Total Bilirubin: 0.5 mg/dL (ref 0.2–1.2)
Total Protein: 7.3 g/dL (ref 6.1–8.1)
eGFR: 107 mL/min/1.73m2 (ref >=60)

## 2024-10-12 LAB — CBC WITH DIFFERENTIAL/PLATELET
Absolute Lymphocytes: 1435 {cells}/uL (ref 850–3900)
Absolute Monocytes: 354 {cells}/uL (ref 200–950)
Basophils Absolute: 31 {cells}/uL (ref 0–200)
Basophils Relative: 0.6 %
Eosinophils Absolute: 73 {cells}/uL (ref 15–500)
Eosinophils Relative: 1.4 %
HCT: 44.2 % (ref 35.0–45.0)
Hemoglobin: 14.3 g/dL (ref 11.7–15.5)
MCH: 31.1 pg (ref 27.0–33.0)
MCHC: 32.4 g/dL (ref 32.0–36.0)
MCV: 96.1 fL (ref 80.0–100.0)
MPV: 11.8 fL (ref 7.5–12.5)
Monocytes Relative: 6.8 %
Neutro Abs: 3307 {cells}/uL (ref 1500–7800)
Neutrophils Relative %: 63.6 %
Platelets: 215 Thousand/uL (ref 140–400)
RBC: 4.6 Million/uL (ref 3.80–5.10)
RDW: 12.4 % (ref 11.0–15.0)
Total Lymphocyte: 27.6 %
WBC: 5.2 Thousand/uL (ref 3.8–10.8)

## 2024-10-12 LAB — LIPID PANEL
Cholesterol: 182 mg/dL (ref <200)
HDL: 72 mg/dL (ref >=50)
LDL Cholesterol (Calc): 96 mg/dL
Non-HDL Cholesterol (Calc): 110 mg/dL (ref <130)
Total CHOL/HDL Ratio: 2.5 (calc) (ref <5.0)
Triglycerides: 55 mg/dL (ref <150)

## 2024-10-12 LAB — HEMOGLOBIN A1C
Hgb A1c MFr Bld: 4.8 % (ref <5.7)
Mean Plasma Glucose: 91 mg/dL
eAG (mmol/L): 5 mmol/L

## 2024-10-29 ENCOUNTER — Other Ambulatory Visit: Payer: Self-pay | Admitting: Nurse Practitioner

## 2024-10-29 DIAGNOSIS — E538 Deficiency of other specified B group vitamins: Secondary | ICD-10-CM

## 2024-10-31 NOTE — Telephone Encounter (Signed)
 Requested Prescriptions  Pending Prescriptions Disp Refills   cyanocobalamin  (VITAMIN B12) 1000 MCG/ML injection [Pharmacy Med Name: CYANOCOBALAMIN  1000MCG/ML INJ, 1ML] 3 mL 11    Sig: ADMINISTER 1 ML(1000 MCG) IN THE MUSCLE EVERY 30 DAYS     Endocrinology:  Vitamins - Vitamin B12 Failed - 10/31/2024 10:50 AM      Failed - B12 Level in normal range and within 360 days    Vitamin B-12  Date Value Ref Range Status  09/07/2022 383 200 - 1,100 pg/mL Final    Comment:    . Please Note: Although the reference range for vitamin B12 is [phone] pg/mL, it has been reported that between 5 and 10% of patients with values between 200 and 400 pg/mL may experience neuropsychiatric and hematologic abnormalities due to occult B12 deficiency; less than 1% of patients with values above 400 pg/mL will have symptoms. .          Passed - HCT in normal range and within 360 days    HCT  Date Value Ref Range Status  10/11/2024 44.2 35.0 - 45.0 % Final   Hematocrit  Date Value Ref Range Status  09/13/2023 43.8 34.0 - 46.6 % Final         Passed - HGB in normal range and within 360 days    Hemoglobin  Date Value Ref Range Status  10/11/2024 14.3 11.7 - 15.5 g/dL Final  90/82/7975 85.3 11.1 - 15.9 g/dL Final         Passed - Valid encounter within last 12 months    Recent Outpatient Visits           2 weeks ago Annual physical exam   [facility] Gareth Clarity F, FNP   7 months ago Class 1 obesity due to excess calories with serious comorbidity and body mass index (BMI) of 30.0 to 30.9 in adult   [facility] [place], Clarity [place], [place]

## 2025-10-15 ENCOUNTER — Encounter: Admitting: Nurse Practitioner
